# Patient Record
Sex: Female | Born: 1940 | Race: White | Hispanic: No | Marital: Married | Smoking: Never smoker
Health system: Southern US, Community
[De-identification: ages and names within clinical notes are randomized; demographics above are authoritative.]

## PROBLEM LIST (undated history)

## (undated) DIAGNOSIS — T8859XA Other complications of anesthesia, initial encounter: Secondary | ICD-10-CM

## (undated) DIAGNOSIS — I10 Essential (primary) hypertension: Secondary | ICD-10-CM

## (undated) DIAGNOSIS — R42 Dizziness and giddiness: Secondary | ICD-10-CM

## (undated) DIAGNOSIS — T4145XA Adverse effect of unspecified anesthetic, initial encounter: Secondary | ICD-10-CM

## (undated) DIAGNOSIS — K219 Gastro-esophageal reflux disease without esophagitis: Secondary | ICD-10-CM

## (undated) HISTORY — PX: NO PAST SURGERIES: SHX2092

---

## 1998-05-01 ENCOUNTER — Other Ambulatory Visit: Admission: RE | Admit: 1998-05-01 | Discharge: 1998-05-01 | Payer: Self-pay | Admitting: Obstetrics and Gynecology

## 1999-10-11 ENCOUNTER — Other Ambulatory Visit: Admission: RE | Admit: 1999-10-11 | Discharge: 1999-10-11 | Payer: Self-pay | Admitting: Obstetrics and Gynecology

## 2001-01-14 ENCOUNTER — Ambulatory Visit (HOSPITAL_COMMUNITY): Admission: RE | Admit: 2001-01-14 | Discharge: 2001-01-14 | Payer: Self-pay | Admitting: Family Medicine

## 2001-01-14 ENCOUNTER — Encounter: Payer: Self-pay | Admitting: Family Medicine

## 2002-02-03 ENCOUNTER — Other Ambulatory Visit: Admission: RE | Admit: 2002-02-03 | Discharge: 2002-02-03 | Payer: Self-pay | Admitting: Obstetrics and Gynecology

## 2003-04-17 ENCOUNTER — Other Ambulatory Visit: Admission: RE | Admit: 2003-04-17 | Discharge: 2003-04-17 | Payer: Self-pay | Admitting: Obstetrics and Gynecology

## 2004-05-28 ENCOUNTER — Other Ambulatory Visit: Admission: RE | Admit: 2004-05-28 | Discharge: 2004-05-28 | Payer: Self-pay | Admitting: Obstetrics and Gynecology

## 2004-12-13 ENCOUNTER — Ambulatory Visit (HOSPITAL_COMMUNITY): Admission: RE | Admit: 2004-12-13 | Discharge: 2004-12-13 | Payer: Self-pay | Admitting: Family Medicine

## 2006-06-08 ENCOUNTER — Emergency Department (HOSPITAL_COMMUNITY): Admission: EM | Admit: 2006-06-08 | Discharge: 2006-06-08 | Payer: Self-pay | Admitting: Emergency Medicine

## 2006-11-27 ENCOUNTER — Ambulatory Visit (HOSPITAL_COMMUNITY): Admission: RE | Admit: 2006-11-27 | Discharge: 2006-11-27 | Payer: Self-pay | Admitting: Family Medicine

## 2007-01-29 ENCOUNTER — Inpatient Hospital Stay (HOSPITAL_COMMUNITY): Admission: EM | Admit: 2007-01-29 | Discharge: 2007-01-31 | Payer: Self-pay | Admitting: Emergency Medicine

## 2010-01-15 ENCOUNTER — Emergency Department (HOSPITAL_COMMUNITY): Admission: EM | Admit: 2010-01-15 | Discharge: 2010-01-15 | Payer: Self-pay | Admitting: Emergency Medicine

## 2010-08-29 ENCOUNTER — Emergency Department (HOSPITAL_COMMUNITY): Admission: EM | Admit: 2010-08-29 | Discharge: 2010-08-29 | Payer: Self-pay | Admitting: Emergency Medicine

## 2011-03-13 LAB — URINALYSIS, ROUTINE W REFLEX MICROSCOPIC
Bilirubin Urine: NEGATIVE
Glucose, UA: NEGATIVE mg/dL
Ketones, ur: NEGATIVE mg/dL
Nitrite: NEGATIVE
Protein, ur: NEGATIVE mg/dL
Specific Gravity, Urine: 1.023 (ref 1.005–1.030)

## 2011-03-13 LAB — DIFFERENTIAL
Basophils Relative: 0 % (ref 0–1)
Eosinophils Relative: 2 % (ref 0–5)
Lymphs Abs: 1.7 10*3/uL (ref 0.7–4.0)
Neutrophils Relative %: 75 % (ref 43–77)

## 2011-03-13 LAB — URINE CULTURE: Culture  Setup Time: 201109010324

## 2011-03-13 LAB — CBC
MCHC: 35 g/dL (ref 30.0–36.0)
RBC: 4.68 MIL/uL (ref 3.87–5.11)
RDW: 13.4 % (ref 11.5–15.5)
WBC: 10.7 10*3/uL — ABNORMAL HIGH (ref 4.0–10.5)

## 2011-03-13 LAB — URINE MICROSCOPIC-ADD ON

## 2011-03-13 LAB — BASIC METABOLIC PANEL
BUN: 21 mg/dL (ref 6–23)
Calcium: 9.9 mg/dL (ref 8.4–10.5)
GFR calc Af Amer: >60 mL/min (ref >60)
Glucose, Bld: 110 mg/dL — ABNORMAL HIGH (ref 70–99)

## 2011-05-16 NOTE — Discharge Summary (Signed)
NAMESOLE, LENGACHER NO.:  [id number]   MEDICAL RECORD NO.:  [id number]          PATIENT TYPE:  INP   LOCATION:  5032                         FACILITY:  [facility]   PHYSICIAN:  Andres Shad. Rudean Curt, MD     DATE OF BIRTH:  1941-09-01   DATE OF ADMISSION:  01/29/2007  DATE OF DISCHARGE:  01/31/2007                               DISCHARGE SUMMARY   DISCHARGE DIAGNOSIS:  Diverticulitis.   CONDITION ON DISCHARGE:  Stable.   SUMMARY OF HOSPITALIZATION:  [name] is a 70 year old woman who  presented to the emergency department with 2 days of severe lower  abdominal pain described as sharp in nature.  It was associated with  nausea and vomiting.  Her only constitutional symptom was weakness.  In  the emergency department, she was found to have a temperature as high as  99.3.  Her white blood cell count was 19.5 with a left shift at the time  of admission.  Initial studies showed evidence of free fluid and a very  small amount of free air in the peritoneal cavity as well as  diverticulitis in the proximal sigmoid colon.  Surgery was consulted and  felt that this episode of diverticulitis could be treated medically.  Thus, the patient was admitted to the medical service and was treated  with intravenous ciprofloxacin and metronidazole.  She did very well  over the next 2 days with relief of her symptoms, return of her white  count to normal at 10.8 at the time of discharge, and normalization of  her temperature.  She was also able to advance to a full diet without  any discomfort or complications.  Because of her rapid response to  antibiotics and resolution of her leukocytosis, I will discharge her at  this time to complete a 14-day course of antibiotics with oral  ciprofloxacin and metronidazole.  The patient has been advised of  dietary recommendations for diverticulitis as well as potential side  effects of her antibiotic.      Andres Shad. Rudean Curt, MD  Electronically  Signed     PML/MEDQ  D:  01/31/2007  T:  01/31/2007  Job:  [id number]

## 2011-05-16 NOTE — H&P (Signed)
Marissa Gallegos, Marissa Gallegos               ACCOUNT NO.:  [id number]   MEDICAL RECORD NO.:  [id number]          PATIENT TYPE:  INP   LOCATION:  1843                         FACILITY:  [facility]   PHYSICIAN:  Jackie Plum, M.D.DATE OF BIRTH:  December 10, 1941   DATE OF ADMISSION:  01/28/2007  DATE OF DISCHARGE:                              HISTORY & PHYSICAL   CHIEF COMPLAINT:  Abdominal pain.   HPI:  The patient is a 70 year old lady who presented with 2 days of  severe low abdominal pain described as sharp in nature without any  radiation.  It was associated with nausea and vomiting, without any  fever or chills, diarrhea or constipation.  The patient has been feeling  weak.  On account of this, she went to her PCP's office and was referred  to the ED.  Emergency room initial evaluation revealed leukocytosis, and  CT scan indicated diverticulitis with suspected perforation.  The  patient is set to be reviewed by Dr. Lindie Spruce of general surgery and felt  the patient will not need any acute surgical intervention, and he  recommended medical admission with surgical co-management as  consultants.  The patient is therefore admitted for IV antibiotics and  supportive care in this regard.   PAST MEDICAL HISTORY:  Positive for:  1. History of hiatal hernia with GERD.  2. Hypertension.   SOCIAL HISTORY:  The patient is married and lives in her own home.  Has  2 older children who live in [place], 1 of whom is an Art gallery manager and  the other is an ER physician.  She does not smoke cigarettes.  She  drinks alcohol on a social basis only.   FAMILY HISTORY:  Positive for hypertension.   REVIEW OF SYSTEMS:  As mentioned above, otherwise unremarkable.   PHYSICAL EXAMINATION:  VITAL SIGNS:  BP 134/81, pulse 95, respirations  20, temp 99.3 degrees Fahrenheit.  O2 sat is 98% on room air.  GENERAL EXAMINATION:  The patient at the time of my evaluation was  comfortable and her pain is about nearly completely  resolved.  She is in  no acute distress.  She is a pleasant lady.  HEENT:  Normocephalic, atraumatic.  Pupils equal, round, and react.  Extraocular movements intact.  Oropharynx:  Moist.  NECK:  Supple.  No JVD.  LUNGS:  Clear to auscultation.  CARDIAC:  Regular rate and rhythm.  No gallops or murmurs.  ABDOMEN:  Obese and soft.  Mild low abdominal tenderness diffusely on  deep palpation only.  Bowel sounds were present and normoactive.  There  was no rebound tenderness appreciated.  EXTREMITIES:  No cyanosis.  No edema.  CNS:  Nonfocal.   LABORATORY:  CT scan as noted above per report.  UA was negative for  UTI.  WBC count 19.5, hemoglobin 15.8, hematocrit 46.3, MCV 88.6,  platelet count 358.  Sodium 135, potassium 3.7, chloride 103, CO2 of 22,  glucose 168, BUN 19, creatinine 0.7.  Serum calcium 10.4.  Total protein  7.1, albumin 3.9, AST 23, ALT 23.  Lipase 16.   IMPRESSION:  1. Diverticulitis with some  content perforation.  __________      discussion with the emergency department physician regarding the      computed tomography report.  2. Leukocytosis secondary to #1.  3. Nausea and vomiting secondary to #1.  4. Hypertension.  5. Gastroesophageal reflux disease.   The patient will be admitted for IV antibiotics, IV fluid  supplementation, and bed rest, and monitoring of her leukocytosis.  We  will also check SGPT.  __________  .  We will hold her Hyzaar, aspirin,  and Lovenox for now and give her IV PPI, with clonidine patch for  antihypertensive treatment.      Jackie Plum, M.D.  Electronically Signed     GO/MEDQ  D:  01/29/2007  T:  01/29/2007  Job:  [id number]

## 2012-03-09 DIAGNOSIS — H524 Presbyopia: Secondary | ICD-10-CM | POA: Diagnosis not present

## 2012-03-09 DIAGNOSIS — H43819 Vitreous degeneration, unspecified eye: Secondary | ICD-10-CM | POA: Diagnosis not present

## 2012-03-09 DIAGNOSIS — H5200 Hypermetropia, unspecified eye: Secondary | ICD-10-CM | POA: Diagnosis not present

## 2012-03-09 DIAGNOSIS — H2510 Age-related nuclear cataract, unspecified eye: Secondary | ICD-10-CM | POA: Diagnosis not present

## 2012-03-19 DIAGNOSIS — Z01419 Encounter for gynecological examination (general) (routine) without abnormal findings: Secondary | ICD-10-CM | POA: Diagnosis not present

## 2012-03-19 DIAGNOSIS — Z124 Encounter for screening for malignant neoplasm of cervix: Secondary | ICD-10-CM | POA: Diagnosis not present

## 2012-11-02 DIAGNOSIS — Z1231 Encounter for screening mammogram for malignant neoplasm of breast: Secondary | ICD-10-CM | POA: Diagnosis not present

## 2012-12-10 DIAGNOSIS — Z23 Encounter for immunization: Secondary | ICD-10-CM | POA: Diagnosis not present

## 2012-12-30 DIAGNOSIS — E559 Vitamin D deficiency, unspecified: Secondary | ICD-10-CM | POA: Diagnosis not present

## 2012-12-30 DIAGNOSIS — Z0000 Encounter for general adult medical examination without abnormal findings: Secondary | ICD-10-CM | POA: Diagnosis not present

## 2012-12-30 DIAGNOSIS — I10 Essential (primary) hypertension: Secondary | ICD-10-CM | POA: Diagnosis not present

## 2012-12-30 DIAGNOSIS — K219 Gastro-esophageal reflux disease without esophagitis: Secondary | ICD-10-CM | POA: Diagnosis not present

## 2012-12-30 DIAGNOSIS — Z23 Encounter for immunization: Secondary | ICD-10-CM | POA: Diagnosis not present

## 2012-12-30 DIAGNOSIS — M899 Disorder of bone, unspecified: Secondary | ICD-10-CM | POA: Diagnosis not present

## 2013-02-09 DIAGNOSIS — E213 Hyperparathyroidism, unspecified: Secondary | ICD-10-CM | POA: Diagnosis not present

## 2013-03-07 DIAGNOSIS — Z8262 Family history of osteoporosis: Secondary | ICD-10-CM | POA: Diagnosis not present

## 2013-03-07 DIAGNOSIS — M899 Disorder of bone, unspecified: Secondary | ICD-10-CM | POA: Diagnosis not present

## 2013-03-14 DIAGNOSIS — H43819 Vitreous degeneration, unspecified eye: Secondary | ICD-10-CM | POA: Diagnosis not present

## 2013-03-14 DIAGNOSIS — D2310 Other benign neoplasm of skin of unspecified eyelid, including canthus: Secondary | ICD-10-CM | POA: Diagnosis not present

## 2013-03-14 DIAGNOSIS — H2510 Age-related nuclear cataract, unspecified eye: Secondary | ICD-10-CM | POA: Diagnosis not present

## 2013-03-14 DIAGNOSIS — H5200 Hypermetropia, unspecified eye: Secondary | ICD-10-CM | POA: Diagnosis not present

## 2013-03-16 DIAGNOSIS — J069 Acute upper respiratory infection, unspecified: Secondary | ICD-10-CM | POA: Diagnosis not present

## 2013-03-16 DIAGNOSIS — J040 Acute laryngitis: Secondary | ICD-10-CM | POA: Diagnosis not present

## 2013-03-23 DIAGNOSIS — H00019 Hordeolum externum unspecified eye, unspecified eyelid: Secondary | ICD-10-CM | POA: Diagnosis not present

## 2013-04-11 DIAGNOSIS — R5383 Other fatigue: Secondary | ICD-10-CM | POA: Diagnosis not present

## 2013-04-11 DIAGNOSIS — M949 Disorder of cartilage, unspecified: Secondary | ICD-10-CM | POA: Diagnosis not present

## 2013-04-11 DIAGNOSIS — Z8639 Personal history of other endocrine, nutritional and metabolic disease: Secondary | ICD-10-CM | POA: Diagnosis not present

## 2013-04-11 DIAGNOSIS — H00019 Hordeolum externum unspecified eye, unspecified eyelid: Secondary | ICD-10-CM | POA: Diagnosis not present

## 2013-04-11 DIAGNOSIS — R5381 Other malaise: Secondary | ICD-10-CM | POA: Diagnosis not present

## 2013-04-11 DIAGNOSIS — G4762 Sleep related leg cramps: Secondary | ICD-10-CM | POA: Diagnosis not present

## 2013-04-12 DIAGNOSIS — H00019 Hordeolum externum unspecified eye, unspecified eyelid: Secondary | ICD-10-CM | POA: Diagnosis not present

## 2013-05-30 DIAGNOSIS — Z124 Encounter for screening for malignant neoplasm of cervix: Secondary | ICD-10-CM | POA: Diagnosis not present

## 2013-05-30 DIAGNOSIS — Z01419 Encounter for gynecological examination (general) (routine) without abnormal findings: Secondary | ICD-10-CM | POA: Diagnosis not present

## 2013-07-05 DIAGNOSIS — I10 Essential (primary) hypertension: Secondary | ICD-10-CM | POA: Diagnosis not present

## 2013-08-15 DIAGNOSIS — I10 Essential (primary) hypertension: Secondary | ICD-10-CM | POA: Diagnosis not present

## 2013-11-03 DIAGNOSIS — Z1231 Encounter for screening mammogram for malignant neoplasm of breast: Secondary | ICD-10-CM | POA: Diagnosis not present

## 2014-01-19 DIAGNOSIS — M25569 Pain in unspecified knee: Secondary | ICD-10-CM | POA: Diagnosis not present

## 2014-01-19 DIAGNOSIS — I10 Essential (primary) hypertension: Secondary | ICD-10-CM | POA: Diagnosis not present

## 2014-01-20 ENCOUNTER — Ambulatory Visit
Admission: RE | Admit: 2014-01-20 | Discharge: 2014-01-20 | Disposition: A | Discharge status: Home / Self Care | Payer: Medicare Other | Source: Ambulatory Visit | Attending: Family Medicine | Admitting: Family Medicine

## 2014-01-20 ENCOUNTER — Other Ambulatory Visit: Payer: Self-pay | Admitting: Family Medicine

## 2014-01-20 DIAGNOSIS — M25569 Pain in unspecified knee: Secondary | ICD-10-CM

## 2014-01-20 DIAGNOSIS — M1710 Unilateral primary osteoarthritis, unspecified knee: Secondary | ICD-10-CM | POA: Diagnosis not present

## 2014-01-20 DIAGNOSIS — IMO0002 Reserved for concepts with insufficient information to code with codable children: Secondary | ICD-10-CM | POA: Diagnosis not present

## 2014-02-01 DIAGNOSIS — M1710 Unilateral primary osteoarthritis, unspecified knee: Secondary | ICD-10-CM | POA: Diagnosis not present

## 2014-03-10 DIAGNOSIS — M25569 Pain in unspecified knee: Secondary | ICD-10-CM | POA: Diagnosis not present

## 2014-03-17 DIAGNOSIS — H5200 Hypermetropia, unspecified eye: Secondary | ICD-10-CM | POA: Diagnosis not present

## 2014-03-17 DIAGNOSIS — H2510 Age-related nuclear cataract, unspecified eye: Secondary | ICD-10-CM | POA: Diagnosis not present

## 2014-03-17 DIAGNOSIS — H43819 Vitreous degeneration, unspecified eye: Secondary | ICD-10-CM | POA: Diagnosis not present

## 2014-03-17 DIAGNOSIS — H524 Presbyopia: Secondary | ICD-10-CM | POA: Diagnosis not present

## 2014-04-10 ENCOUNTER — Other Ambulatory Visit: Payer: Self-pay | Admitting: Orthopaedic Surgery

## 2014-04-11 ENCOUNTER — Encounter (HOSPITAL_COMMUNITY): Payer: Self-pay | Admitting: Pharmacy Technician

## 2014-04-15 NOTE — Pre-Procedure Instructions (Signed)
Marissa Gallegos  04/15/2014   Your procedure is scheduled on:  April 28  Report to [facility] Entrance "A" [address] at [employer] AM.  Call this number if you have problems the morning of surgery: [phone]   Remember:   Do not eat food or drink liquids after midnight.   Take these medicines the morning of surgery with A SIP OF WATER: Amlodipine, Clonidine, Nexium,    STOP Multiple Vitamins, Magnesium, Voltaren, Vitamin D3, Calcium, today   STOP/ Do not take Aspirin, Aleve, Naproxen, Advil, Ibuprofen, Vitamin, Herbs, or Supplements starting today    Do not wear jewelry, make-up or nail polish.  Do not wear lotions, powders, or perfumes. You may wear deodorant.  Do not shave 48 hours prior to surgery. Men may shave face and neck.  Do not bring valuables to the hospital.  [facility] is not responsible for any belongings or valuables.               Contacts, dentures or bridgework may not be worn into surgery.  Leave suitcase in the car. After surgery it may be brought to your room.  For patients admitted to the hospital, discharge time is determined by your treatment team.               Special Instructions: See [facility] Preparing For Surgery   Please read over the following fact sheets that you were given: Pain Booklet, Coughing and Deep Breathing, Blood Transfusion Information, Total Joint Packet and Surgical Site Infection Prevention

## 2014-04-15 NOTE — Pre-Procedure Instructions (Signed)
[facility] - Preparing for Surgery  Before surgery, you can play an important role.  Because skin is not sterile, your skin needs to be as free of germs as possible.  You can reduce the number of germs on you skin by washing with CHG (chlorahexidine gluconate) soap before surgery.  CHG is an antiseptic cleaner which kills germs and bonds with the skin to continue killing germs even after washing.  Please DO NOT use if you have an allergy to CHG or antibacterial soaps.  If your skin becomes reddened/irritated stop using the CHG and inform your nurse when you arrive at Short Stay.  Do not shave (including legs and underarms) for at least 48 hours prior to the first CHG shower.  You may shave your face.  Please follow these instructions carefully:   1.  Shower with CHG Soap the night before surgery and the morning of Surgery.  2.  If you choose to wash your hair, wash your hair first as usual with your normal shampoo.  3.  After you shampoo, rinse your hair and body thoroughly to remove the shampoo.  4.  Use CHG as you would any other liquid soap.  You can apply CHG directly to the skin and wash gently with scrungie or a clean washcloth.  5.  Apply the CHG Soap to your body ONLY FROM THE NECK DOWN.  Do not use on open wounds or open sores.  Avoid contact with your eyes, ears, mouth and genitals (private parts).  Wash genitals (private parts) with your normal soap.  6.  Wash thoroughly, paying special attention to the area where your surgery will be performed.  7.  Thoroughly rinse your body with warm water from the neck down.  8.  DO NOT shower/wash with your normal soap after using and rinsing off the CHG Soap.  9.  Pat yourself dry with a clean towel.            10.  Wear clean pajamas.            11.  Place clean sheets on your bed the night of your first shower and do not sleep with pets.  Day of Surgery  Do not apply any lotions the morning of surgery.  Please wear clean clothes to the  hospital/surgery center.   

## 2014-04-17 ENCOUNTER — Encounter (HOSPITAL_COMMUNITY)
Admission: RE | Admit: 2014-04-17 | Discharge: 2014-04-17 | Disposition: A | Discharge status: Home / Self Care | Payer: Medicare Other | Source: Ambulatory Visit | Attending: Orthopaedic Surgery | Admitting: Orthopaedic Surgery

## 2014-04-17 ENCOUNTER — Encounter (HOSPITAL_COMMUNITY): Payer: Self-pay

## 2014-04-17 DIAGNOSIS — Z01810 Encounter for preprocedural cardiovascular examination: Secondary | ICD-10-CM | POA: Diagnosis not present

## 2014-04-17 DIAGNOSIS — Z01812 Encounter for preprocedural laboratory examination: Secondary | ICD-10-CM | POA: Insufficient documentation

## 2014-04-17 DIAGNOSIS — Z01818 Encounter for other preprocedural examination: Secondary | ICD-10-CM | POA: Diagnosis not present

## 2014-04-17 HISTORY — DX: Other complications of anesthesia, initial encounter: T88.59XA

## 2014-04-17 HISTORY — DX: Dizziness and giddiness: R42

## 2014-04-17 HISTORY — DX: Adverse effect of unspecified anesthetic, initial encounter: T41.45XA

## 2014-04-17 HISTORY — DX: Essential (primary) hypertension: I10

## 2014-04-17 HISTORY — DX: Gastro-esophageal reflux disease without esophagitis: K21.9

## 2014-04-17 LAB — CBC WITH DIFFERENTIAL/PLATELET
BASOS ABS: 0 10*3/uL (ref 0.0–0.1)
BASOS PCT: 0 % (ref 0–1)
Eosinophils Absolute: 0.1 10*3/uL (ref 0.0–0.7)
Eosinophils Relative: 1 % (ref 0–5)
HCT: 45.2 % (ref 36.0–46.0)
Hemoglobin: 15.2 g/dL — ABNORMAL HIGH (ref 12.0–15.0)
Lymphocytes Relative: 29 % (ref 12–46)
Lymphs Abs: 1.9 10*3/uL (ref 0.7–4.0)
MCH: 30.6 pg (ref 26.0–34.0)
MCHC: 33.6 g/dL (ref 30.0–36.0)
MCV: 91.1 fL (ref 78.0–100.0)
Monocytes Absolute: 0.5 10*3/uL (ref 0.1–1.0)
Monocytes Relative: 7 % (ref 3–12)
NEUTROS ABS: 4.1 10*3/uL (ref 1.7–7.7)
Neutrophils Relative %: 63 % (ref 43–77)
PLATELETS: 285 10*3/uL (ref 150–400)
RBC: 4.96 MIL/uL (ref 3.87–5.11)
RDW: 13.3 % (ref 11.5–15.5)
WBC: 6.5 10*3/uL (ref 4.0–10.5)

## 2014-04-17 LAB — PROTIME-INR
INR: 1.05 (ref 0.00–1.49)
Prothrombin Time: 13.5 seconds (ref 11.6–15.2)

## 2014-04-17 LAB — URINE MICROSCOPIC-ADD ON

## 2014-04-17 LAB — TYPE AND SCREEN
ABO/RH(D): O POS
Antibody Screen: NEGATIVE

## 2014-04-17 LAB — BASIC METABOLIC PANEL
BUN: 16 mg/dL (ref 6–23)
CO2: 24 mEq/L (ref 19–32)
CREATININE: 0.71 mg/dL (ref 0.50–1.10)
Calcium: 11.8 mg/dL — ABNORMAL HIGH (ref 8.4–10.5)
Chloride: 105 mEq/L (ref 96–112)
GFR, EST NON AFRICAN AMERICAN: 85 mL/min — AB (ref >90)
Glucose, Bld: 103 mg/dL — ABNORMAL HIGH (ref 70–99)
Potassium: 4 mEq/L (ref 3.7–5.3)
Sodium: 144 mEq/L (ref 137–147)

## 2014-04-17 LAB — URINALYSIS, ROUTINE W REFLEX MICROSCOPIC
Bilirubin Urine: NEGATIVE
GLUCOSE, UA: NEGATIVE mg/dL
KETONES UR: NEGATIVE mg/dL
Nitrite: NEGATIVE
Protein, ur: NEGATIVE mg/dL
Specific Gravity, Urine: 1.022 (ref 1.005–1.030)
Urobilinogen, UA: 0.2 mg/dL (ref 0.0–1.0)
pH: 6 (ref 5.0–8.0)

## 2014-04-17 LAB — ABO/RH: ABO/RH(D): O POS

## 2014-04-17 LAB — SURGICAL PCR SCREEN
MRSA, PCR: POSITIVE — AB
Staphylococcus aureus: POSITIVE — AB

## 2014-04-17 LAB — APTT: aPTT: 31 seconds (ref 24–37)

## 2014-04-18 DIAGNOSIS — B351 Tinea unguium: Secondary | ICD-10-CM | POA: Diagnosis not present

## 2014-04-18 DIAGNOSIS — M79609 Pain in unspecified limb: Secondary | ICD-10-CM | POA: Diagnosis not present

## 2014-04-24 MED ORDER — CEFAZOLIN SODIUM-DEXTROSE 2-3 GM-% IV SOLR
2.0000 g | INTRAVENOUS | Status: AC
Start: 2014-04-25 — End: 2014-04-25
  Administered 2014-04-25: 2 g via INTRAVENOUS
  Filled 2014-04-24: qty 50

## 2014-04-24 NOTE — H&P (Signed)
TOTAL KNEE ADMISSION H&P  Patient is being admitted for right total knee arthroplasty.  Subjective:  Chief Complaint:right knee pain.  HPI: Marissa Gallegos, 73 y.o. female, has a history of pain and functional disability in the right knee due to arthritis and has failed non-surgical conservative treatments for greater than 12 weeks to includeNSAID's and/or analgesics, corticosteriod injections, flexibility and strengthening excercises, weight reduction as appropriate and activity modification.  Onset of symptoms was gradual, starting 3 years ago with gradually worsening course since that time. The patient noted no past surgery on the right knee(s).  Patient currently rates pain in the right knee(s) at 10 out of 10 with activity. Patient has night pain, worsening of pain with activity and weight bearing, pain that interferes with activities of daily living and crepitus.  Patient has evidence of subchondral cysts, subchondral sclerosis, periarticular osteophytes and joint space narrowing by imaging studies. There is no active infection.  There are no active problems to display for this patient.  Past Medical History  Diagnosis Date  . GERD (gastroesophageal reflux disease)   . Complication of anesthesia     poss low BP with anesthesia,vagal  . Hypertension     dr r. Kenton Kingfisher  . Vertigo     Past Surgical History  Procedure Laterality Date  . No past surgeries      No prescriptions prior to admission   No Known Allergies  History  Substance Use Topics  . Smoking status: Never Smoker   . Smokeless tobacco: Not on file  . Alcohol Use: Yes     Comment: occ    No family history on file.   Review of Systems  Musculoskeletal: Positive for joint pain.       Right greater than left knee pain  All other systems reviewed and are negative.   Objective:  Physical Exam  Constitutional: She is oriented to person, place, and time. She appears well-developed and well-nourished.  HENT:  Head:  Normocephalic and atraumatic.  Eyes: Conjunctivae and EOM are normal. Pupils are equal, round, and reactive to light.  Neck: Normal range of motion.  Cardiovascular: Normal rate and normal heart sounds.   Respiratory: Effort normal.  GI: Soft.  Musculoskeletal:  Both knees move about 0-120.  There is no effusion on either side.  She has crepitation right greater than left and medial joint line pain on both sides but worse on the right.   Hip motion is full and pain free and SLR is negative on both sides.  There is no palpable LAD behind either knee.  Sensation and motor function are intact on both sides and there are palpable pulses on both sides.    Neurological: She is alert and oriented to person, place, and time.  Skin: Skin is warm and dry.  Psychiatric: She has a normal mood and affect. Her behavior is normal. Judgment and thought content normal.    Vital signs in last 24 hours:    Labs:   There is no height or weight on file to calculate BMI.   Imaging Review Plain radiographs demonstrate severe degenerative joint disease of the right knee(s). The overall alignment ismild varus. The bone quality appears to be good for age and reported activity level.  Assessment/Plan:  End stage arthritis, right knee  The patient history, physical examination, clinical judgment of the provider and imaging studies are consistent with end stage degenerative joint disease of the right knee(s) and total knee arthroplasty is deemed medically necessary. The treatment  options including medical management, injection therapy arthroscopy and arthroplasty were discussed at length. The risks and benefits of total knee arthroplasty were presented and reviewed. The risks due to aseptic loosening, infection, stiffness, patella tracking problems, thromboembolic complications and other imponderables were discussed. The patient acknowledged the explanation, agreed to proceed with the plan and consent was signed.  Patient is being admitted for inpatient treatment for surgery, pain control, PT, OT, prophylactic antibiotics, VTE prophylaxis, progressive ambulation and ADL's and discharge planning. The patient is planning to be discharged home with home health services

## 2014-04-25 ENCOUNTER — Encounter (HOSPITAL_COMMUNITY)
Admission: RE | Disposition: A | Discharge status: Home / Self Care | Payer: Self-pay | Source: Ambulatory Visit | Attending: Orthopaedic Surgery

## 2014-04-25 ENCOUNTER — Inpatient Hospital Stay (HOSPITAL_COMMUNITY): Payer: Medicare Other | Admitting: Anesthesiology

## 2014-04-25 ENCOUNTER — Inpatient Hospital Stay (HOSPITAL_COMMUNITY)
Admission: RE | Admit: 2014-04-25 | Discharge: 2014-04-27 | DRG: 470 | Disposition: A | Discharge status: Home / Self Care | Payer: Medicare Other | Source: Ambulatory Visit | Attending: Orthopaedic Surgery | Admitting: Orthopaedic Surgery

## 2014-04-25 ENCOUNTER — Encounter (HOSPITAL_COMMUNITY): Payer: Medicare Other | Admitting: Anesthesiology

## 2014-04-25 ENCOUNTER — Encounter (HOSPITAL_COMMUNITY): Payer: Self-pay | Admitting: *Deleted

## 2014-04-25 DIAGNOSIS — I10 Essential (primary) hypertension: Secondary | ICD-10-CM | POA: Diagnosis not present

## 2014-04-25 DIAGNOSIS — G8918 Other acute postprocedural pain: Secondary | ICD-10-CM | POA: Diagnosis not present

## 2014-04-25 DIAGNOSIS — M25569 Pain in unspecified knee: Secondary | ICD-10-CM | POA: Diagnosis not present

## 2014-04-25 DIAGNOSIS — K219 Gastro-esophageal reflux disease without esophagitis: Secondary | ICD-10-CM | POA: Diagnosis not present

## 2014-04-25 DIAGNOSIS — M1711 Unilateral primary osteoarthritis, right knee: Secondary | ICD-10-CM | POA: Diagnosis present

## 2014-04-25 DIAGNOSIS — IMO0002 Reserved for concepts with insufficient information to code with codable children: Secondary | ICD-10-CM | POA: Diagnosis not present

## 2014-04-25 DIAGNOSIS — M1710 Unilateral primary osteoarthritis, unspecified knee: Principal | ICD-10-CM | POA: Diagnosis present

## 2014-04-25 HISTORY — PX: TOTAL KNEE ARTHROPLASTY: SHX125

## 2014-04-25 SURGERY — ARTHROPLASTY, KNEE, TOTAL
Anesthesia: General | Site: Knee | Laterality: Right

## 2014-04-25 MED ORDER — ROCURONIUM BROMIDE 50 MG/5ML IV SOLN
INTRAVENOUS | Status: AC
  Filled 2014-04-25: qty 1

## 2014-04-25 MED ORDER — HYDROCODONE-ACETAMINOPHEN 5-325 MG PO TABS
1.0000 | ORAL_TABLET | ORAL | Status: DC | PRN
  Administered 2014-04-25 – 2014-04-27 (×8): 2 via ORAL
  Filled 2014-04-25 (×8): qty 2

## 2014-04-25 MED ORDER — LIDOCAINE HCL (CARDIAC) 20 MG/ML IV SOLN
INTRAVENOUS | Status: AC
  Filled 2014-04-25: qty 5

## 2014-04-25 MED ORDER — VITAMIN D3 25 MCG (1000 UNIT) PO TABS
1000.0000 [IU] | ORAL_TABLET | Freq: Every day | ORAL | Status: DC
  Administered 2014-04-25 – 2014-04-26 (×2): 1000 [IU] via ORAL
  Filled 2014-04-25 (×3): qty 1

## 2014-04-25 MED ORDER — ARTIFICIAL TEARS OP OINT
TOPICAL_OINTMENT | OPHTHALMIC | Status: AC
  Filled 2014-04-25: qty 3.5

## 2014-04-25 MED ORDER — LACTATED RINGERS IV SOLN
INTRAVENOUS | Status: DC
  Administered 2014-04-25 – 2014-04-26 (×2): via INTRAVENOUS

## 2014-04-25 MED ORDER — PROPOFOL 10 MG/ML IV BOLUS
INTRAVENOUS | Status: AC
  Filled 2014-04-25: qty 20

## 2014-04-25 MED ORDER — METOCLOPRAMIDE HCL 5 MG/ML IJ SOLN
10.0000 mg | Freq: Once | INTRAMUSCULAR | Status: AC
  Administered 2014-04-25: 10 mg via INTRAVENOUS

## 2014-04-25 MED ORDER — HYDROCHLOROTHIAZIDE 25 MG PO TABS
25.0000 mg | ORAL_TABLET | Freq: Every day | ORAL | Status: DC
  Administered 2014-04-25 – 2014-04-27 (×3): 25 mg via ORAL
  Filled 2014-04-25 (×3): qty 1

## 2014-04-25 MED ORDER — LOSARTAN POTASSIUM 50 MG PO TABS
100.0000 mg | ORAL_TABLET | Freq: Every day | ORAL | Status: DC
  Administered 2014-04-25 – 2014-04-27 (×3): 100 mg via ORAL
  Filled 2014-04-25 (×3): qty 2

## 2014-04-25 MED ORDER — FENTANYL CITRATE 0.05 MG/ML IJ SOLN
INTRAMUSCULAR | Status: AC
  Filled 2014-04-25: qty 5

## 2014-04-25 MED ORDER — METOCLOPRAMIDE HCL 5 MG/ML IJ SOLN
5.0000 mg | Freq: Three times a day (TID) | INTRAMUSCULAR | Status: DC | PRN
  Administered 2014-04-26: 10 mg via INTRAVENOUS
  Filled 2014-04-25: qty 2

## 2014-04-25 MED ORDER — ONDANSETRON HCL 4 MG/2ML IJ SOLN
4.0000 mg | Freq: Four times a day (QID) | INTRAMUSCULAR | Status: DC | PRN
  Administered 2014-04-25 – 2014-04-26 (×2): 4 mg via INTRAVENOUS
  Filled 2014-04-25 (×2): qty 2

## 2014-04-25 MED ORDER — GLYCOPYRROLATE 0.2 MG/ML IJ SOLN
INTRAMUSCULAR | Status: AC
  Filled 2014-04-25: qty 2

## 2014-04-25 MED ORDER — PHENYLEPHRINE HCL 10 MG/ML IJ SOLN
INTRAMUSCULAR | Status: DC | PRN
  Administered 2014-04-25 (×2): 80 ug via INTRAVENOUS

## 2014-04-25 MED ORDER — PANTOPRAZOLE SODIUM 40 MG PO TBEC
80.0000 mg | DELAYED_RELEASE_TABLET | Freq: Every day | ORAL | Status: DC
  Administered 2014-04-26 – 2014-04-27 (×2): 80 mg via ORAL
  Filled 2014-04-25 (×2): qty 2

## 2014-04-25 MED ORDER — ONDANSETRON HCL 4 MG PO TABS: 4.0000 mg | ORAL_TABLET | Freq: Four times a day (QID) | ORAL | Status: DC | PRN

## 2014-04-25 MED ORDER — METHOCARBAMOL 100 MG/ML IJ SOLN
500.0000 mg | Freq: Four times a day (QID) | INTRAVENOUS | Status: DC | PRN
  Administered 2014-04-25: 500 mg via INTRAVENOUS
  Filled 2014-04-25 (×2): qty 5

## 2014-04-25 MED ORDER — ONDANSETRON HCL 4 MG/2ML IJ SOLN
INTRAMUSCULAR | Status: DC | PRN
  Administered 2014-04-25: 4 mg via INTRAVENOUS

## 2014-04-25 MED ORDER — METOCLOPRAMIDE HCL 5 MG/ML IJ SOLN
INTRAMUSCULAR | Status: AC
  Filled 2014-04-25: qty 2

## 2014-04-25 MED ORDER — VITAMIN D-3 25 MCG (1000 UT) PO CAPS: 1000.0000 [IU] | ORAL_CAPSULE | Freq: Two times a day (BID) | ORAL | Status: DC

## 2014-04-25 MED ORDER — NEOSTIGMINE METHYLSULFATE 1 MG/ML IJ SOLN
INTRAMUSCULAR | Status: DC | PRN
  Administered 2014-04-25: 3 mg via INTRAVENOUS

## 2014-04-25 MED ORDER — PHENYLEPHRINE 40 MCG/ML (10ML) SYRINGE FOR IV PUSH (FOR BLOOD PRESSURE SUPPORT)
PREFILLED_SYRINGE | INTRAVENOUS | Status: AC
  Filled 2014-04-25: qty 10

## 2014-04-25 MED ORDER — MIDAZOLAM HCL 5 MG/5ML IJ SOLN
INTRAMUSCULAR | Status: DC | PRN
  Administered 2014-04-25: 2 mg via INTRAVENOUS

## 2014-04-25 MED ORDER — HYDROMORPHONE HCL PF 1 MG/ML IJ SOLN
0.2500 mg | INTRAMUSCULAR | Status: DC | PRN
  Administered 2014-04-25 (×2): 0.25 mg via INTRAVENOUS

## 2014-04-25 MED ORDER — ONDANSETRON HCL 4 MG/2ML IJ SOLN
INTRAMUSCULAR | Status: AC
  Filled 2014-04-25: qty 2

## 2014-04-25 MED ORDER — NORETHINDRONE-ETH ESTRADIOL 1-5 MG-MCG PO TABS: 1.0000 | ORAL_TABLET | Freq: Every day | ORAL | Status: DC

## 2014-04-25 MED ORDER — NEOSTIGMINE METHYLSULFATE 1 MG/ML IJ SOLN
INTRAMUSCULAR | Status: AC
  Filled 2014-04-25: qty 10

## 2014-04-25 MED ORDER — DOCUSATE SODIUM 100 MG PO CAPS
100.0000 mg | ORAL_CAPSULE | Freq: Two times a day (BID) | ORAL | Status: DC
  Administered 2014-04-25 – 2014-04-27 (×4): 100 mg via ORAL
  Filled 2014-04-25 (×5): qty 1

## 2014-04-25 MED ORDER — ACETAMINOPHEN 650 MG RE SUPP: 650.0000 mg | Freq: Four times a day (QID) | RECTAL | Status: DC | PRN

## 2014-04-25 MED ORDER — MAGNESIUM OXIDE 400 (241.3 MG) MG PO TABS
200.0000 mg | ORAL_TABLET | Freq: Two times a day (BID) | ORAL | Status: DC
  Administered 2014-04-25 – 2014-04-27 (×5): 200 mg via ORAL
  Filled 2014-04-25 (×6): qty 0.5

## 2014-04-25 MED ORDER — VITAMIN D3 25 MCG (1000 UNIT) PO TABS
2000.0000 [IU] | ORAL_TABLET | Freq: Every day | ORAL | Status: DC
  Administered 2014-04-26 – 2014-04-27 (×2): 2000 [IU] via ORAL
  Filled 2014-04-25 (×2): qty 2

## 2014-04-25 MED ORDER — PROPOFOL 10 MG/ML IV BOLUS
INTRAVENOUS | Status: DC | PRN
  Administered 2014-04-25: 160 mg via INTRAVENOUS

## 2014-04-25 MED ORDER — VANCOMYCIN HCL IN DEXTROSE 1-5 GM/200ML-% IV SOLN
INTRAVENOUS | Status: AC
  Filled 2014-04-25: qty 200

## 2014-04-25 MED ORDER — CHLORHEXIDINE GLUCONATE 4 % EX LIQD
60.0000 mL | Freq: Once | CUTANEOUS | Status: DC
  Filled 2014-04-25: qty 60

## 2014-04-25 MED ORDER — ALUM & MAG HYDROXIDE-SIMETH 200-200-20 MG/5ML PO SUSP: 30.0000 mL | ORAL | Status: DC | PRN

## 2014-04-25 MED ORDER — METHOCARBAMOL 500 MG PO TABS
500.0000 mg | ORAL_TABLET | Freq: Four times a day (QID) | ORAL | Status: DC | PRN
  Administered 2014-04-25 – 2014-04-27 (×5): 500 mg via ORAL
  Filled 2014-04-25 (×6): qty 1

## 2014-04-25 MED ORDER — ROCURONIUM BROMIDE 100 MG/10ML IV SOLN
INTRAVENOUS | Status: DC | PRN
  Administered 2014-04-25: 30 mg via INTRAVENOUS

## 2014-04-25 MED ORDER — BISACODYL 5 MG PO TBEC: 5.0000 mg | DELAYED_RELEASE_TABLET | Freq: Every day | ORAL | Status: DC | PRN

## 2014-04-25 MED ORDER — GLYCOPYRROLATE 0.2 MG/ML IJ SOLN
INTRAMUSCULAR | Status: DC | PRN
  Administered 2014-04-25: 0.4 mg via INTRAVENOUS

## 2014-04-25 MED ORDER — ASPIRIN EC 325 MG PO TBEC
325.0000 mg | DELAYED_RELEASE_TABLET | Freq: Two times a day (BID) | ORAL | Status: DC
  Administered 2014-04-26 – 2014-04-27 (×3): 325 mg via ORAL
  Filled 2014-04-25 (×5): qty 1

## 2014-04-25 MED ORDER — LIDOCAINE HCL (CARDIAC) 20 MG/ML IV SOLN
INTRAVENOUS | Status: DC | PRN
  Administered 2014-04-25: 60 mg via INTRAVENOUS

## 2014-04-25 MED ORDER — HYDROMORPHONE HCL PF 1 MG/ML IJ SOLN
INTRAMUSCULAR | Status: AC
  Filled 2014-04-25: qty 1

## 2014-04-25 MED ORDER — MENTHOL 3 MG MT LOZG: 1.0000 | LOZENGE | OROMUCOSAL | Status: DC | PRN

## 2014-04-25 MED ORDER — CEFAZOLIN SODIUM-DEXTROSE 2-3 GM-% IV SOLR
2.0000 g | Freq: Four times a day (QID) | INTRAVENOUS | Status: AC
  Administered 2014-04-25 (×2): 2 g via INTRAVENOUS
  Filled 2014-04-25 (×2): qty 50

## 2014-04-25 MED ORDER — 0.9 % SODIUM CHLORIDE (POUR BTL) OPTIME
TOPICAL | Status: DC | PRN
  Administered 2014-04-25: 1000 mL

## 2014-04-25 MED ORDER — SODIUM CHLORIDE 0.9 % IR SOLN
Status: DC | PRN
  Administered 2014-04-25 (×2): 1000 mL

## 2014-04-25 MED ORDER — MIDAZOLAM HCL 2 MG/2ML IJ SOLN
INTRAMUSCULAR | Status: AC
  Filled 2014-04-25: qty 2

## 2014-04-25 MED ORDER — ACETAMINOPHEN 325 MG PO TABS: 650.0000 mg | ORAL_TABLET | Freq: Four times a day (QID) | ORAL | Status: DC | PRN

## 2014-04-25 MED ORDER — PHENOL 1.4 % MT LIQD: 1.0000 | OROMUCOSAL | Status: DC | PRN

## 2014-04-25 MED ORDER — LACTATED RINGERS IV SOLN
INTRAVENOUS | Status: DC | PRN
  Administered 2014-04-25: 07:00:00 via INTRAVENOUS

## 2014-04-25 MED ORDER — DIPHENHYDRAMINE HCL 12.5 MG/5ML PO ELIX: 12.5000 mg | ORAL_SOLUTION | ORAL | Status: DC | PRN

## 2014-04-25 MED ORDER — VANCOMYCIN HCL IN DEXTROSE 1-5 GM/200ML-% IV SOLN
1000.0000 mg | Freq: Once | INTRAVENOUS | Status: DC
  Filled 2014-04-25: qty 200

## 2014-04-25 MED ORDER — HYDROMORPHONE HCL PF 1 MG/ML IJ SOLN
0.5000 mg | INTRAMUSCULAR | Status: DC | PRN
  Administered 2014-04-25 – 2014-04-26 (×2): 1 mg via INTRAVENOUS
  Filled 2014-04-25 (×2): qty 1

## 2014-04-25 MED ORDER — METHOCARBAMOL 500 MG PO TABS
500.0000 mg | ORAL_TABLET | ORAL | Status: DC
  Filled 2014-04-25: qty 1

## 2014-04-25 MED ORDER — LACTATED RINGERS IV SOLN: INTRAVENOUS | Status: DC

## 2014-04-25 MED ORDER — AMLODIPINE BESYLATE 10 MG PO TABS
10.0000 mg | ORAL_TABLET | Freq: Every day | ORAL | Status: DC
  Administered 2014-04-26 – 2014-04-27 (×2): 10 mg via ORAL
  Filled 2014-04-25 (×2): qty 1

## 2014-04-25 MED ORDER — VANCOMYCIN HCL 1000 MG IV SOLR
1000.0000 mg | Freq: Two times a day (BID) | INTRAVENOUS | Status: DC
  Administered 2014-04-25 (×2): 1000 mg via INTRAVENOUS
  Filled 2014-04-25 (×4): qty 1000

## 2014-04-25 MED ORDER — FERROUS SULFATE 325 (65 FE) MG PO TABS
325.0000 mg | ORAL_TABLET | Freq: Two times a day (BID) | ORAL | Status: DC
  Administered 2014-04-25 – 2014-04-27 (×4): 325 mg via ORAL
  Filled 2014-04-25 (×6): qty 1

## 2014-04-25 MED ORDER — FENTANYL CITRATE 0.05 MG/ML IJ SOLN
INTRAMUSCULAR | Status: DC | PRN
  Administered 2014-04-25: 100 ug via INTRAVENOUS
  Administered 2014-04-25 (×6): 50 ug via INTRAVENOUS

## 2014-04-25 MED ORDER — CLONIDINE HCL 0.1 MG PO TABS
0.1000 mg | ORAL_TABLET | Freq: Two times a day (BID) | ORAL | Status: DC
  Administered 2014-04-25 – 2014-04-27 (×4): 0.1 mg via ORAL
  Filled 2014-04-25 (×5): qty 1

## 2014-04-25 MED ORDER — MAGNESIUM 250 MG PO TABS: 250.0000 mg | ORAL_TABLET | Freq: Two times a day (BID) | ORAL | Status: DC

## 2014-04-25 MED ORDER — METOCLOPRAMIDE HCL 10 MG PO TABS: 5.0000 mg | ORAL_TABLET | Freq: Three times a day (TID) | ORAL | Status: DC | PRN

## 2014-04-25 SURGICAL SUPPLY — 72 items
BANDAGE ELASTIC 4 VELCRO ST LF (GAUZE/BANDAGES/DRESSINGS) ×3 IMPLANT
BANDAGE ESMARK 6X9 LF (GAUZE/BANDAGES/DRESSINGS) ×1 IMPLANT
BANDAGE GAUZE ELAST BULKY 4 IN (GAUZE/BANDAGES/DRESSINGS) ×4 IMPLANT
BLADE 10 SAFETY STRL DISP (BLADE) ×3 IMPLANT
BLADE SAGITTAL 25.0X1.19X90 (BLADE) ×2 IMPLANT
BLADE SAGITTAL 25.0X1.19X90MM (BLADE) ×1
BLADE SURG 10 STRL SS (BLADE) ×2 IMPLANT
BLADE SURG ROTATE 9660 (MISCELLANEOUS) IMPLANT
BNDG CMPR 9X6 STRL LF SNTH (GAUZE/BANDAGES/DRESSINGS) ×1
BNDG CMPR MED 10X6 ELC LF (GAUZE/BANDAGES/DRESSINGS) ×1
BNDG ELASTIC 6X10 VLCR STRL LF (GAUZE/BANDAGES/DRESSINGS) ×3 IMPLANT
BNDG ESMARK 6X9 LF (GAUZE/BANDAGES/DRESSINGS) ×3
BOWL SMART MIX CTS (DISPOSABLE) ×3 IMPLANT
CAPT RP KNEE ×2 IMPLANT
CEMENT HV SMART SET (Cement) ×6 IMPLANT
COVER SURGICAL LIGHT HANDLE (MISCELLANEOUS) ×3 IMPLANT
CUFF TOURNIQUET SINGLE 34IN LL (TOURNIQUET CUFF) ×3 IMPLANT
CUFF TOURNIQUET SINGLE 44IN (TOURNIQUET CUFF) IMPLANT
DRAPE EXTREMITY T 121X128X90 (DRAPE) ×3 IMPLANT
DRAPE PROXIMA HALF (DRAPES) ×3 IMPLANT
DRAPE U-SHAPE 47X51 STRL (DRAPES) ×3 IMPLANT
DRSG ADAPTIC 3X8 NADH LF (GAUZE/BANDAGES/DRESSINGS) ×3 IMPLANT
DRSG PAD ABDOMINAL 8X10 ST (GAUZE/BANDAGES/DRESSINGS) ×3 IMPLANT
DURAPREP 26ML APPLICATOR (WOUND CARE) ×3 IMPLANT
ELECT REM PT RETURN 9FT ADLT (ELECTROSURGICAL) ×3
ELECTRODE REM PT RTRN 9FT ADLT (ELECTROSURGICAL) ×1 IMPLANT
FACESHIELD WRAPAROUND (MASK) IMPLANT
FACESHIELD WRAPAROUND OR TEAM (MASK) ×2 IMPLANT
GLOVE BIO SURGEON STRL SZ8.5 (GLOVE) IMPLANT
GLOVE BIOGEL PI IND STRL 7.0 (GLOVE) IMPLANT
GLOVE BIOGEL PI IND STRL 8 (GLOVE) ×2 IMPLANT
GLOVE BIOGEL PI IND STRL 8.5 (GLOVE) IMPLANT
GLOVE BIOGEL PI INDICATOR 7.0 (GLOVE) ×2
GLOVE BIOGEL PI INDICATOR 8 (GLOVE) ×6
GLOVE BIOGEL PI INDICATOR 8.5 (GLOVE)
GLOVE SS BIOGEL STRL SZ 8 (GLOVE) ×2 IMPLANT
GLOVE SUPERSENSE BIOGEL SZ 8 (GLOVE) ×4
GLOVE SURG SS PI 7.0 STRL IVOR (GLOVE) ×2 IMPLANT
GLOVE SURG SS PI 8.0 STRL IVOR (GLOVE) ×2 IMPLANT
GOWN STRL REUS W/ TWL LRG LVL3 (GOWN DISPOSABLE) ×1 IMPLANT
GOWN STRL REUS W/ TWL XL LVL3 (GOWN DISPOSABLE) ×1 IMPLANT
GOWN STRL REUS W/TWL 2XL LVL3 (GOWN DISPOSABLE) ×3 IMPLANT
GOWN STRL REUS W/TWL LRG LVL3 (GOWN DISPOSABLE)
GOWN STRL REUS W/TWL XL LVL3 (GOWN DISPOSABLE) ×9
HANDPIECE INTERPULSE COAX TIP (DISPOSABLE) ×3
HOOD PEEL AWAY FACE SHEILD DIS (HOOD) ×7 IMPLANT
IMMOBILIZER KNEE 20 (SOFTGOODS) IMPLANT
IMMOBILIZER KNEE 22 UNIV (SOFTGOODS) ×3 IMPLANT
IMMOBILIZER KNEE 24 THIGH 36 (MISCELLANEOUS) IMPLANT
IMMOBILIZER KNEE 24 UNIV (MISCELLANEOUS)
KIT BASIN OR (CUSTOM PROCEDURE TRAY) ×3 IMPLANT
KIT ROOM TURNOVER OR (KITS) ×3 IMPLANT
MANIFOLD NEPTUNE II (INSTRUMENTS) ×3 IMPLANT
NDL HYPO 21X1 ECLIPSE (NEEDLE) ×1 IMPLANT
NEEDLE HYPO 21X1 ECLIPSE (NEEDLE) IMPLANT
NS IRRIG 1000ML POUR BTL (IV SOLUTION) ×3 IMPLANT
PACK TOTAL JOINT (CUSTOM PROCEDURE TRAY) ×3 IMPLANT
PAD ARMBOARD 7.5X6 YLW CONV (MISCELLANEOUS) ×6 IMPLANT
SET HNDPC FAN SPRY TIP SCT (DISPOSABLE) ×1 IMPLANT
SPONGE GAUZE 4X4 12PLY (GAUZE/BANDAGES/DRESSINGS) ×3 IMPLANT
STAPLER VISISTAT 35W (STAPLE) IMPLANT
SUCTION FRAZIER TIP 10 FR DISP (SUCTIONS) ×2 IMPLANT
SUT MNCRL AB 3-0 PS2 18 (SUTURE) IMPLANT
SUT VIC AB 0 CT1 27 (SUTURE) ×6
SUT VIC AB 0 CT1 27XBRD ANBCTR (SUTURE) ×2 IMPLANT
SUT VIC AB 2-0 CT1 27 (SUTURE) ×6
SUT VIC AB 2-0 CT1 TAPERPNT 27 (SUTURE) ×2 IMPLANT
SUT VLOC 180 0 24IN GS25 (SUTURE) ×3 IMPLANT
SYR 50ML LL SCALE MARK (SYRINGE) ×3 IMPLANT
TOWEL OR 17X24 6PK STRL BLUE (TOWEL DISPOSABLE) ×3 IMPLANT
TOWEL OR 17X26 10 PK STRL BLUE (TOWEL DISPOSABLE) ×3 IMPLANT
TRAY FOLEY CATH 14FR (SET/KITS/TRAYS/PACK) ×1 IMPLANT

## 2014-04-25 NOTE — Plan of Care (Signed)
Problem: Consults Goal: Diagnosis- Total Joint Replacement Primary Total Knee Right     

## 2014-04-25 NOTE — Op Note (Signed)
PREOP DIAGNOSIS: DJD RIGHT KNEE POSTOP DIAGNOSIS: same PROCEDURE: RIGHT TKR ANESTHESIA: General and block ATTENDING SURGEON: Hessie Dibble ASSISTANT: Loni Dolly PA  INDICATIONS FOR PROCEDURE: Marissa Gallegos is a 73 y.o. female who has struggled for a long time with pain due to degenerative arthritis of the right knee.  The patient has failed many conservative non-operative measures and at this point has pain which limits the ability to sleep and walk.  The patient is offered total knee replacement.  Informed operative consent was obtained after discussion of possible risks of anesthesia, infection, neurovascular injury, DVT, and death.  The importance of the post-operative rehabilitation protocol to optimize result was stressed extensively with the patient.  SUMMARY OF FINDINGS AND PROCEDURE:  Marissa Gallegos was taken to the operative suite where under the above anesthesia a right knee replacement was performed.  There were advanced degenerative changes and the bone quality was excellent.  We used the DePuy system and placed size standard plus femur, 4 tibia, 38 mm all polyethylene patella, and a size 10 mm spacer.  Loni Dolly PA-C assisted throughout and was invaluable to the completion of the case in that he helped retract and maintain exposure while I placed components.  He also helped close thereby minimizing OR time.  The patient was admitted for appropriate post-op care to include perioperative antibiotics and mechanical and pharmacologic measures for DVT prophylaxis.  DESCRIPTION OF PROCEDURE:  Marissa Gallegos was taken to the operative suite where the above anesthesia was applied.  The patient was positioned supine and prepped and draped in normal sterile fashion.  An appropriate time out was performed.  After the administration of Kefzol and Vancomycin pre-op antibiotic the leg was elevated and exsanguinated and a tourniquet inflated. A standard longitudinal incision was made on the anterior  knee.  Dissection was carried down to the extensor mechanism.  All appropriate anti-infective measures were used including the pre-operative antibiotic, betadine impregnated drape, and closed hooded exhaust systems for each member of the surgical team.  A medial parapatellar incision was made in the extensor mechanism and the knee cap flipped and the knee flexed.  Some residual meniscal tissues were removed along with any remaining ACL/PCL tissue.  A guide was placed on the tibia and a flat cut was made on it's superior surface.  An intramedullary guide was placed in the femur and was utilized to make anterior and posterior cuts creating an appropriate flexion gap.  A second intramedullary guide was placed in the femur to make a distal cut properly balancing the knee with an extension gap equal to the flexion gap.  The three bones sized to the above mentioned sizes and the appropriate guides were placed and utilized.  A trial reduction was done and the knee easily came to full extension and the patella tracked well on flexion.  The trial components were removed and all bones were cleaned with pulsatile lavage and then dried thoroughly.  Cement was mixed and was pressurized onto the bones followed by placement of the aforementioned components.  Excess cement was trimmed and pressure was held on the components until the cement had hardened.  The tourniquet was deflated and a small amount of bleeding was controlled with cautery and pressure.  The knee was irrigated thoroughly.  The extensor mechanism was re-approximated with V-loc suture in running fashion.  The knee was flexed and the repair was solid.  The subcutaneous tissues were re-approximated with #0 and #2-0 vicryl and the skin closed with a  subcuticular stitch and steristrips.  A sterile dressing was applied.  Intraoperative fluids, EBL, and tourniquet time can be obtained from anesthesia records.  DISPOSITION:  The patient was taken to recovery room in  stable condition and admitted for appropriate post-op care to include peri-operative antibiotic and DVT prophylaxis with mechanical and pharmacologic measures.  Marissa Gallegos 04/25/2014, 9:03 AM

## 2014-04-25 NOTE — Transfer of Care (Signed)
Immediate Anesthesia Transfer of Care Note  Patient: Marissa Gallegos  Procedure(s) Performed: Procedure(s): TOTAL KNEE ARTHROPLASTY (Right)  Patient Location: PACU  Anesthesia Type:General  Level of Consciousness: awake, alert  and oriented  Airway & Oxygen Therapy: Patient Spontanous Breathing and Patient connected to nasal cannula oxygen  Post-op Assessment: Report given to PACU RN and Post -op Vital signs reviewed and stable  Post vital signs: Reviewed and stable  Complications: No apparent anesthesia complications

## 2014-04-25 NOTE — Progress Notes (Signed)
Orthopedic Tech Progress Note Patient Details:  Marissa Gallegos 1941-02-01 [id number]  CPM Right Knee CPM Right Knee: On Right Knee Flexion (Degrees): 60 Right Knee Extension (Degrees): 0 Additional Comments: Trapeze bar   Theodoro Parma Cammer 04/25/2014, 10:13 AM

## 2014-04-25 NOTE — Evaluation (Signed)
Physical Therapy Evaluation Patient Details Name: Marissa Gallegos MRN: [id number] DOB: 09-17-1941 Today's Date: 04/25/2014   History of Present Illness  73 y/o female with h/o GERD, HTN and vertigo admitted for right TKA.  Clinical Impression  Patient presents with decreased independence with mobility due to deficits listed in PT problem list.  She will benefit from skilled PT in the acute setting to allow return home with family assist and HHPT.    Follow Up Recommendations Home health PT;Supervision/Assistance - 24 hour    Equipment Recommendations  None recommended by PT    Recommendations for Other Services       Precautions / Restrictions Precautions Precautions: Fall Required Braces or Orthoses: Knee Immobilizer - Right Restrictions Weight Bearing Restrictions: Yes RLE Weight Bearing: Weight bearing as tolerated      Mobility  Bed Mobility Overal bed mobility: Needs Assistance Bed Mobility: Supine to Sit     Supine to sit: Min assist     General bed mobility comments: for right LE, cues for technique  Transfers Overall transfer level: Needs assistance Equipment used: Rolling walker (2 wheeled) Transfers: Sit to/from Stand Sit to Stand: Min assist         General transfer comment: assist for steadying and cues for technique for right LE management  Ambulation/Gait Ambulation/Gait assistance: Min assist Ambulation Distance (Feet): 40 Feet Assistive device: Rolling walker (2 wheeled) Gait Pattern/deviations: Step-to pattern;Antalgic;Decreased stride length;Decreased step length - left     General Gait Details: cues for sequence and technique  Stairs            Wheelchair Mobility    Modified Rankin (Stroke Patients Only)       Balance Overall balance assessment: Needs assistance         Standing balance support: Bilateral upper extremity supported Standing balance-Leahy Scale: Poor Standing balance comment: UE assist for balance                              Pertinent Vitals/Pain Right knee 6/10    Home Living Family/patient expects to be discharged to:: Private residence Living Arrangements: Spouse/significant other Available Help at Discharge: Family;Available 24 hours/day Type of Home: House Home Access: Stairs to enter Entrance Stairs-Rails: None Entrance Stairs-Number of Steps: 2 Home Layout: One level Home Equipment: Walker - 2 wheels;Bedside commode;Other (comment) (CPM)      Prior Function Level of Independence: Independent               Hand Dominance        Extremity/Trunk Assessment               Lower Extremity Assessment: RLE deficits/detail RLE Deficits / Details: AAROM grossly -5 to 65 degrees       Communication   Communication: No difficulties  Cognition Arousal/Alertness: Awake/alert Behavior During Therapy: Anxious Overall Cognitive Status: Within Functional Limits for tasks assessed                      General Comments      Exercises Total Joint Exercises Ankle Circles/Pumps: AROM;Both;10 reps;Supine Quad Sets: AROM;Both;10 reps;Supine Heel Slides: AAROM;Right;10 reps;Supine      Assessment/Plan    PT Assessment Patient needs continued PT services  PT Diagnosis Difficulty walking;Acute pain   PT Problem List Decreased strength;Decreased range of motion;Decreased activity tolerance;Decreased balance;Pain;Decreased mobility;Decreased knowledge of use of DME;Decreased knowledge of precautions  PT Treatment Interventions DME instruction;Therapeutic exercise;Gait training;Balance training;Functional  mobility training;Cognitive remediation;Stair training;Therapeutic activities;Patient/family education   PT Goals (Current goals can be found in the Care Plan section) Acute Rehab PT Goals Patient Stated Goal: To go home PT Goal Formulation: With patient/family Time For Goal Achievement: 05/02/14 Potential to Achieve Goals: Good    Frequency  Min 6X/week   Barriers to discharge        Co-evaluation               End of Session Equipment Utilized During Treatment: Gait belt;Right knee immobilizer Activity Tolerance: Patient tolerated treatment well Patient left: with call bell/phone within reach;with family/visitor present           Time: 1435-1500 PT Time Calculation (min): 25 min   Charges:   PT Evaluation $Initial PT Evaluation Tier I: 1 Procedure PT Treatments $Gait Training: 8-22 mins   PT G CodesMax Sane 04/25/2014, 3:08 PM Magda Kiel, [place] 04/25/2014

## 2014-04-25 NOTE — Anesthesia Postprocedure Evaluation (Signed)
  Anesthesia Post-op Note  Patient: Marissa Gallegos  Procedure(s) Performed: Procedure(s): TOTAL KNEE ARTHROPLASTY (Right)  Patient Location: PACU  Anesthesia Type:General  Level of Consciousness: awake  Airway and Oxygen Therapy: Patient Spontanous Breathing  Post-op Pain: 1 /10, mild  Post-op Assessment: Post-op Vital signs reviewed  Post-op Vital Signs: Reviewed  Last Vitals:  Filed Vitals:   04/25/14 1100  BP: 135/56  Pulse: 81  Temp: 36.6 C  Resp: 14    Complications: No apparent anesthesia complications

## 2014-04-25 NOTE — Care Management Note (Signed)
CARE MANAGEMENT NOTE 04/25/2014  Patient:  Marissa Gallegos, Marissa Gallegos   Account Number:  [id number]  Date Initiated:  04/25/2014  Documentation initiated by:  Ricki Miller  Subjective/Objective Assessment:   73 yr old female s/p right total knee arthroplasty.     Action/Plan:   PT/OT to eval. Case manager will continue to monitor.   Anticipated DC Date:     Anticipated DC Plan:           Choice offered to / List presented to:             Status of service:  In process, will continue to follow

## 2014-04-25 NOTE — Anesthesia Procedure Notes (Addendum)
Anesthesia Regional Block:  Femoral nerve block  Pre-Anesthetic Checklist: ,, timeout performed, Correct Patient, Correct Site, Correct Laterality, Correct Procedure, Correct Position, site marked, Risks and benefits discussed,  Surgical consent,  Pre-op evaluation,  At surgeon's request and post-op pain management  Laterality: Right  Prep: chloraprep       Needles:  Injection technique: Single-shot  Needle Type: Stimulator Needle - 80          Additional Needles:  Procedures: Doppler guided and ultrasound guided (picture in chart) Femoral nerve block  Nerve Stimulator or Paresthesia:  Response: 0.5 mA,   Additional Responses:   Narrative:  Start time: 04/25/2014 7:00 AM End time: 04/25/2014 7:15 AM Injection made incrementally with aspirations every 5 mL.  Performed by: Personally  Anesthesiologist: Dr. Oletta Lamas   Procedure Name: Intubation Date/Time: 04/25/2014 7:38 AM Performed by: Kyung Rudd Pre-anesthesia Checklist: Patient identified, Emergency Drugs available, Suction available, Patient being monitored and Timeout performed Patient Re-evaluated:Patient Re-evaluated prior to inductionOxygen Delivery Method: Circle system utilized Preoxygenation: Pre-oxygenation with 100% oxygen Intubation Type: IV induction Ventilation: Mask ventilation without difficulty Laryngoscope Size: Mac and 3 Grade View: Grade I Tube type: Oral Tube size: 7.5 mm Number of attempts: 1 Airway Equipment and Method: Stylet Placement Confirmation: ETT inserted through vocal cords under direct vision,  positive ETCO2 and breath sounds checked- equal and bilateral Secured at: 22 cm Tube secured with: Tape Dental Injury: Teeth and Oropharynx as per pre-operative assessment

## 2014-04-25 NOTE — Anesthesia Preprocedure Evaluation (Addendum)
Anesthesia Evaluation  Patient identified by MRN, date of birth, ID band Patient awake    Reviewed: Allergy & Precautions, H&P , NPO status , Patient's Chart, lab work & pertinent test results  Airway Mallampati: I      Dental  (+) Teeth Intact   Pulmonary neg pulmonary ROS,          Cardiovascular hypertension, Pt. on medications     Neuro/Psych    GI/Hepatic Neg liver ROS, GERD-  Medicated and Controlled,  Endo/Other    Renal/GU negative Renal ROS     Musculoskeletal   Abdominal   Peds  Hematology   Anesthesia Other Findings   Reproductive/Obstetrics                        Anesthesia Physical Anesthesia Plan  ASA: III  Anesthesia Plan: General   Post-op Pain Management:    Induction: Intravenous  Airway Management Planned: Oral ETT  Additional Equipment:   Intra-op Plan:   Post-operative Plan: Extubation in OR  Informed Consent:   Dental advisory given  Plan Discussed with: CRNA and Anesthesiologist  Anesthesia Plan Comments:        Anesthesia Quick Evaluation

## 2014-04-25 NOTE — Evaluation (Signed)
Occupational Therapy Evaluation Patient Details Name: Marissa Gallegos MRN: [id number] DOB: September 07, 1941 Today's Date: 04/25/2014    History of Present Illness 73 y/o female with h/o GERD, HTN and vertigo admitted for right TKA.   Clinical Impression   Pt presents with below problem list. Pt independent with ADLs, PTA. Feel pt will benefit from acute OT to increase independence prior to d/c.       Follow Up Recommendations  No OT follow up;Supervision - Intermittent    Equipment Recommendations  None recommended by OT    Recommendations for Other Services       Precautions / Restrictions Precautions Precautions: Fall Required Braces or Orthoses: Knee Immobilizer - Right Restrictions Weight Bearing Restrictions: Yes RLE Weight Bearing: Weight bearing as tolerated      Mobility  Transfers Overall transfer level: Needs assistance Equipment used: Rolling walker (2 wheeled) Transfers: Sit to/from Stand Sit to Stand: Min guard         General transfer comment: cues for technique.         ADL Overall ADL's : Needs assistance/impaired     Grooming: Wash/dry hands;Min guard;Standing;Supervision/safety               Lower Body Dressing: Min guard;Sit to/from stand   Toilet Transfer: Ambulation;RW;Minimal assistance (3 in 1 over commode)   Toileting- Clothing Manipulation and Hygiene: Min guard;Sit to/from stand       Functional mobility during ADLs: Minimal assistance;Rolling walker General ADL Comments: Educated on use of bag on walker, safe shoewear and to sit for most of dressing. Told pt AE is available if LB dressing is more difficult later. Educated on shower transfer technique. Educated on dressing technique.  Educated on benefit of reaching down to don/doff right sock as it increases ROM in knee.     Vision                     Perception     Praxis      Pertinent Vitals/Pain Pain 1/10. Repositioned.      Hand Dominance      Extremity/Trunk Assessment Upper Extremity Assessment Upper Extremity Assessment: Overall WFL for tasks assessed   Lower Extremity Assessment Lower Extremity Assessment: Defer to PT evaluation RLE Deficits / Details: AAROM grossly -5 to 65 degrees       Communication Communication Communication: No difficulties   Cognition Arousal/Alertness: Awake/alert Behavior During Therapy: Anxious Overall Cognitive Status: Within Functional Limits for tasks assessed                     General Comments       Exercises       Shoulder Instructions      Home Living Family/patient expects to be discharged to:: Private residence Living Arrangements: Spouse/significant other Available Help at Discharge: Family;Available 24 hours/day Type of Home: House Home Access: Stairs to enter [employer] of Steps: 2 Entrance Stairs-Rails: None Home Layout: One level     Bathroom Shower/Tub: Occupational psychologist:  (has BSC)     Home Equipment: Environmental consultant - 2 wheels;Bedside commode;Other (comment);Shower seat - built in (CPM)          Prior Functioning/Environment Level of Independence: Independent             OT Diagnosis: Acute pain   OT Problem List: Decreased strength;Decreased knowledge of use of DME or AE;Decreased knowledge of precautions;Increased edema;Decreased range of motion   OT Treatment/Interventions: Self-care/ADL training;DME and/or AE  instruction;Therapeutic activities;Patient/family education;Balance training    OT Goals(Current goals can be found in the care plan section) Acute Rehab OT Goals Patient Stated Goal: not stated OT Goal Formulation: With patient Time For Goal Achievement: 05/02/14 Potential to Achieve Goals: Good ADL Goals Pt Will Perform Lower Body Dressing: sit to/from stand;with modified independence Pt Will Transfer to Toilet: with modified independence;ambulating (3 in 1 over commode) Pt Will Perform Toileting -  Clothing Manipulation and hygiene: with modified independence;sit to/from stand Pt Will Perform Tub/Shower Transfer: Shower transfer;with supervision;ambulating;shower seat;rolling walker  OT Frequency: Min 2X/week   Barriers to D/C:            Co-evaluation              End of Session Equipment Utilized During Treatment: Gait belt;Rolling walker;Right knee immobilizer CPM Right Knee CPM Right Knee: Off  Activity Tolerance: Patient tolerated treatment well Patient left: in chair;with call bell/phone within reach;with family/visitor present   Time: 0630-1601 OT Time Calculation (min): 32 min Charges:  OT General Charges $OT Visit: 1 Procedure OT Evaluation $Initial OT Evaluation Tier I: 1 Procedure OT Treatments $Self Care/Home Management : 8-22 mins G-Codes:    Benito Mccreedy OTR/L [phone] 04/25/2014, 5:31 PM

## 2014-04-25 NOTE — Progress Notes (Signed)
Dr Oletta Lamas informed of unable to remove  Ring.

## 2014-04-25 NOTE — Progress Notes (Signed)
Utilization review completed.  

## 2014-04-25 NOTE — Interval H&P Note (Signed)
History and Physical Interval Note:  04/25/2014 7:26 AM  Marissa Gallegos  has presented today for surgery, with the diagnosis of RIGHT KNEE DEGENERATIVE JOINT DISEASE  The various methods of treatment have been discussed with the patient and family. After consideration of risks, benefits and other options for treatment, the patient has consented to  Procedure(s): TOTAL KNEE ARTHROPLASTY (Right) as a surgical intervention .  The patient's history has been reviewed, patient examined, no change in status, stable for surgery.  I have reviewed the patient's chart and labs.  Questions were answered to the patient's satisfaction.     Hessie Dibble

## 2014-04-25 NOTE — Progress Notes (Signed)
Unable to remove gold colored wedding band. Pt to let the Dr know.

## 2014-04-26 LAB — BASIC METABOLIC PANEL
BUN: 11 mg/dL (ref 6–23)
CHLORIDE: 105 meq/L (ref 96–112)
CO2: 26 mEq/L (ref 19–32)
Calcium: 9.8 mg/dL (ref 8.4–10.5)
Creatinine, Ser: 0.6 mg/dL (ref 0.50–1.10)
GFR calc Af Amer: >90 mL/min (ref >90)
GFR, EST NON AFRICAN AMERICAN: 90 mL/min — AB (ref >90)
Glucose, Bld: 140 mg/dL — ABNORMAL HIGH (ref 70–99)
POTASSIUM: 3.3 meq/L — AB (ref 3.7–5.3)
SODIUM: 141 meq/L (ref 137–147)

## 2014-04-26 LAB — CBC
HEMATOCRIT: 32.5 % — AB (ref 36.0–46.0)
HEMOGLOBIN: 10.8 g/dL — AB (ref 12.0–15.0)
MCH: 30.3 pg (ref 26.0–34.0)
MCHC: 33.2 g/dL (ref 30.0–36.0)
MCV: 91 fL (ref 78.0–100.0)
Platelets: 231 10*3/uL (ref 150–400)
RBC: 3.57 MIL/uL — ABNORMAL LOW (ref 3.87–5.11)
RDW: 13.5 % (ref 11.5–15.5)
WBC: 7.7 10*3/uL (ref 4.0–10.5)

## 2014-04-26 MED ORDER — VANCOMYCIN HCL IN DEXTROSE 1-5 GM/200ML-% IV SOLN
1000.0000 mg | Freq: Two times a day (BID) | INTRAVENOUS | Status: DC
  Filled 2014-04-26 (×2): qty 200

## 2014-04-26 MED ORDER — VANCOMYCIN HCL 1000 MG IV SOLR
1000.0000 mg | Freq: Once | INTRAVENOUS | Status: AC
  Administered 2014-04-26: 1000 mg via INTRAVENOUS
  Filled 2014-04-26: qty 1000

## 2014-04-26 NOTE — Plan of Care (Signed)
Problem: Consults Goal: Diagnosis- Total Joint Replacement Primary Total Knee Right     

## 2014-04-26 NOTE — Progress Notes (Signed)
Occupational Therapy Treatment Patient Details Name: Marissa Gallegos MRN: [id number] DOB: [birth year] Today's Date: 04/26/2014    History of present illness 73 y/o female with h/o GERD, HTN and vertigo admitted for right TKA.   OT comments  Practiced shower transfer and LB dressing. Pt moving well during session. Feel pt is safe to d/c home, from OT standpoint, with spouse available to assist.   Follow Up Recommendations  No OT follow up;Supervision - Intermittent    Equipment Recommendations  None recommended by OT    Recommendations for Other Services      Precautions / Restrictions Precautions Precautions: Fall Required Braces or Orthoses: Knee Immobilizer - Right Restrictions Weight Bearing Restrictions: Yes RLE Weight Bearing: Weight bearing as tolerated       Mobility Bed Mobility Overal bed mobility: Needs Assistance Bed Mobility: Supine to Sit     Supine to sit: Supervision     General bed mobility comments: Told pt she could hook other foot around LLE to assist. Pt able to move EOB without doing so with extra time. Practiced without rail.  Transfers Overall transfer level: Needs assistance Equipment used: Rolling walker (2 wheeled) Transfers: Sit to/from Stand Sit to Stand: Supervision;Min guard         General transfer comment: cues for hand placement    Balance                                   ADL Overall ADL's : Needs assistance/impaired                     Lower Body Dressing: Supervision/safety;Sitting/lateral leans (right sock)   Toilet Transfer: Min guard;Ambulation;RW (3 in 1 over commode)   Toileting- Clothing Manipulation and Hygiene: Supervision/safety (standing)   Tub/ Shower Transfer: Min guard;Ambulation;Shower seat;Rolling walker   Functional mobility during ADLs: Min guard;Rolling walker General ADL Comments: Practiced shower transfer. Reviewed safety tips. Pt able to don/doff right sock today.       Vision                     Perception     Praxis      Cognition   Behavior During Therapy: Anxious Overall Cognitive Status: Within Functional Limits for tasks assessed                       Extremity/Trunk Assessment                  Shoulder Instructions       General Comments      Pertinent Vitals/ Pain       Pain 1/10. Increased activity during session.   Home Living                                          Prior Functioning/Environment              Frequency Min 2X/week     Progress Toward Goals  OT Goals(current goals can now be found in the care plan section)  Progress towards OT goals: Progressing toward goals  Acute Rehab OT Goals Patient Stated Goal: not stated OT Goal Formulation: With patient Time For Goal Achievement: 05/02/14 Potential to Achieve Goals: Good ADL Goals Pt Will Perform Lower Body Dressing: sit to/from stand;with  modified independence Pt Will Transfer to Toilet: with modified independence;ambulating (3 in 1 over commode) Pt Will Perform Toileting - Clothing Manipulation and hygiene: with modified independence;sit to/from stand Pt Will Perform Tub/Shower Transfer: Shower transfer;with supervision;ambulating;shower seat;rolling walker  Plan Discharge plan remains appropriate    Co-evaluation                 End of Session Equipment Utilized During Treatment: Gait belt;Rolling walker;Right knee immobilizer   Activity Tolerance Patient tolerated treatment well   Patient Left Other (comment) (on toilet-increased time to [facility] tech aware)   Nurse Communication          Time: 1607-3710 OT Time Calculation (min): 22 min  Charges: OT General Charges $OT Visit: 1 Procedure OT Treatments $Self Care/Home Management : 8-22 mins  Benito Mccreedy OTR/L [phone] 04/26/2014, 12:50 PM

## 2014-04-26 NOTE — Progress Notes (Signed)
Physical Therapy Treatment Patient Details Name: Marissa Gallegos MRN: [id number] DOB: 12-05-1941 Today's Date: 04/26/2014    History of Present Illness 73 y/o female with h/o GERD, HTN and vertigo admitted for right TKA.    PT Comments    Patient demonstrates more self confidence with gait training this session. Patient continues to progress towards goals with increased gait training and stairs today. Patient requested her husband demonstrate donning/doffing knee immobilizer and was able to perform with Min A for proper technique. Continue plan of care with d/c HHPT tomorrow with supervision for safety.   Follow Up Recommendations  Home health PT;Supervision/Assistance - 24 hour     Equipment Recommendations  None recommended by PT    Recommendations for Other Services       Precautions / Restrictions Precautions Precautions: Fall Required Braces or Orthoses: Knee Immobilizer - Right Restrictions Weight Bearing Restrictions: Yes RLE Weight Bearing: Weight bearing as tolerated    Mobility  Bed Mobility Overal bed mobility: Needs Assistance Bed Mobility: Supine to Sit;Sit to Supine     Supine to sit: Supervision     General bed mobility comments: patient able to demonstrate bed mobility without use of rail.  Transfers Overall transfer level: Needs assistance Equipment used: Rolling walker (2 wheeled) Transfers: Sit to/from Stand Sit to Stand: Min guard         General transfer comment: patient able to sit to stand x 3  Ambulation/Gait Ambulation/Gait assistance: Min guard Ambulation Distance (Feet): 200 Feet Assistive device: Rolling walker (2 wheeled) Gait Pattern/deviations: Step-through pattern;Decreased stride length     General Gait Details: patient less anxious with gait training and able to increase pace last 75'   Stairs Stairs: Yes Stairs assistance: Min assist Stair Management: One rail Left Number of Stairs: 2 General stair comments: Patient  able to ascend/ descend stairs with cues for safe technique. Post treatment pt able to recall proper sequencing.   Wheelchair Mobility    Modified Rankin (Stroke Patients Only)       Balance                                    Cognition Arousal/Alertness: Awake/alert Behavior During Therapy: Anxious Overall Cognitive Status: Within Functional Limits for tasks assessed                      Exercises Total Joint Exercises Ankle Circles/Pumps: AROM;Both;10 reps;Supine Quad Sets: AROM;Both;10 reps;Supine Heel Slides: Right;10 reps;Supine;AROM    General Comments        Pertinent Vitals/Pain Patient reports 1/10 pain level post PT. Patient repositioned for comfort.     Home Living                      Prior Function            PT Goals (current goals can now be found in the care plan section) Acute Rehab PT Goals Patient Stated Goal: not stated Progress towards PT goals: Progressing toward goals    Frequency  Min 6X/week    PT Plan Current plan remains appropriate    Co-evaluation             End of Session Equipment Utilized During Treatment: Gait belt;Right knee immobilizer Activity Tolerance: Patient tolerated treatment well Patient left: with call bell/phone within reach;in bed;with family/visitor present     Time: 5176-1607 PT Time Calculation (min): 32  min  Charges:  $Gait Training: 23-37 mins $Therapeutic Exercise: 8-22 mins                    G Codes:      [place], SPTA 04/26/2014, 1:44 PM

## 2014-04-26 NOTE — Progress Notes (Signed)
Seen and agreed 04/26/2014 [place] PTA [phone] pager [phone] office

## 2014-04-26 NOTE — Progress Notes (Signed)
Subjective: 1 Day Post-Op Procedure(s) (LRB): TOTAL KNEE ARTHROPLASTY (Right)  Activity level:  wbat Diet tolerance:  Eating well Voiding:  ok Patient reports pain as 1 on 0-10 scale.    Objective: Vital signs in last 24 hours: Temp:  [97.9 F (36.6 C)-99.1 F (37.3 C)] 97.9 F (36.6 C) (04/29 0447) Pulse Rate:  [77-94] 77 (04/29 0447) Resp:  [13-26] 18 (04/29 0447) BP: (124-141)/(51-63) 131/53 mmHg (04/29 0447) SpO2:  [94 %-99 %] 94 % (04/29 0447)  Labs:  Recent Labs  04/26/14 0415  HGB 10.8*    Recent Labs  04/26/14 0415  WBC 7.7  RBC 3.57*  HCT 32.5*  PLT 231    Recent Labs  04/26/14 0415  NA 141  K 3.3*  CL 105  CO2 26  BUN 11  CREATININE 0.60  GLUCOSE 140*  CALCIUM 9.8   No results found for this basename: LABPT, INR,  in the last 72 hours  Physical Exam:  Neurologically intact ABD soft Neurovascular intact Sensation intact distally Dorsiflexion/Plantar flexion intact Incision: dressing C/D/I No cellulitis present  Assessment/Plan:  1 Day Post-Op Procedure(s) (LRB): TOTAL KNEE ARTHROPLASTY (Right) Advance diet Up with therapy D/C IV fluids Plan for discharge tomorrow Discharge home with home health Continue ASA 325mg  bid x 2 weeks Follow up in 2 weeks  Dressing change to mepilex. Stop using foam footsie.    Marissa Gallegos Marissa Gallegos 04/26/2014, 8:05 AM

## 2014-04-26 NOTE — Progress Notes (Signed)
Physical Therapy Treatment Patient Details Name: Marissa Gallegos MRN: [id number] DOB: [birth year] Today's Date: 04/26/2014    History of Present Illness 73 y/o female with h/o GERD, HTN and vertigo admitted for right TKA.    PT Comments    Nurse is room prior to therapy to remove lines and adm medication. Patient is anxious with therapy and requires reassurance throughout session despite being able to tolerate increased gait training with good technique. Patient still requires knee immobilizer with gait training. Continue to progress towards goals. Attempt stair training next PT. Continue to recommend HHPT at this time with A with mobility and safety.    Follow Up Recommendations  Home health PT;Supervision/Assistance - 24 hour     Equipment Recommendations  None recommended by PT    Recommendations for Other Services       Precautions / Restrictions Precautions Precautions: Fall Required Braces or Orthoses: Knee Immobilizer - Right Restrictions RLE Weight Bearing: Weight bearing as tolerated    Mobility  Bed Mobility Overal bed mobility: Needs Assistance Bed Mobility: Supine to Sit     Supine to sit: Min assist     General bed mobility comments: Patient required min A for LE protection.   Transfers Overall transfer level: Needs assistance Equipment used: Rolling walker (2 wheeled) Transfers: Sit to/from Stand Sit to Stand: Min guard         General transfer comment: cues for technique.  Ambulation/Gait Ambulation/Gait assistance: Min assist Ambulation Distance (Feet): 100 Feet Assistive device: Rolling walker (2 wheeled) Gait Pattern/deviations: Step-to pattern     General Gait Details: weight shifting for WB status prior to gait training. Patient anxious throughout gait training, multiple thoughts and unable to focus.   Stairs            Wheelchair Mobility    Modified Rankin (Stroke Patients Only)       Balance                                     Cognition Arousal/Alertness: Awake/alert Behavior During Therapy: Anxious Overall Cognitive Status: Within Functional Limits for tasks assessed                      Exercises Total Joint Exercises Ankle Circles/Pumps: AROM;Both;10 reps;Supine Quad Sets: AROM;Both;10 reps;Supine Heel Slides: Right;10 reps;Supine;AROM    General Comments        Pertinent Vitals/Pain Patient denies pain.    Home Living                      Prior Function            PT Goals (current goals can now be found in the care plan section) Progress towards PT goals: Progressing toward goals    Frequency  Min 6X/week    PT Plan Current plan remains appropriate    Co-evaluation             End of Session Equipment Utilized During Treatment: Gait belt;Right knee immobilizer Activity Tolerance: Patient tolerated treatment well Patient left: with call bell/phone within reach;in chair     Time: 0815-0905 PT Time Calculation (min): 50 min  Charges:                       G Codes:      [place], SPTA 04/26/2014, 10:03 AM

## 2014-04-26 NOTE — Progress Notes (Signed)
Seen and agreed 04/26/2014 Julia Elizabeth Robinette PTA [phone] pager [phone] office    

## 2014-04-27 ENCOUNTER — Encounter (HOSPITAL_COMMUNITY): Payer: Self-pay | Admitting: Orthopaedic Surgery

## 2014-04-27 LAB — CBC
HEMATOCRIT: 32.4 % — AB (ref 36.0–46.0)
HEMOGLOBIN: 10.6 g/dL — AB (ref 12.0–15.0)
MCH: 30.4 pg (ref 26.0–34.0)
MCHC: 32.7 g/dL (ref 30.0–36.0)
MCV: 92.8 fL (ref 78.0–100.0)
Platelets: 227 10*3/uL (ref 150–400)
RBC: 3.49 MIL/uL — AB (ref 3.87–5.11)
RDW: 13.7 % (ref 11.5–15.5)
WBC: 8.5 10*3/uL (ref 4.0–10.5)

## 2014-04-27 MED ORDER — HYDROCODONE-ACETAMINOPHEN 5-325 MG PO TABS: 1.0000 | ORAL_TABLET | Freq: Four times a day (QID) | ORAL | Status: AC | PRN

## 2014-04-27 MED ORDER — METHOCARBAMOL 500 MG PO TABS: 500.0000 mg | ORAL_TABLET | Freq: Four times a day (QID) | ORAL | Status: DC | PRN

## 2014-04-27 MED ORDER — ASPIRIN 325 MG PO TBEC: 325.0000 mg | DELAYED_RELEASE_TABLET | Freq: Two times a day (BID) | ORAL | Status: AC

## 2014-04-27 NOTE — Progress Notes (Signed)
Seen and agreed 04/27/2014 Julia Elizabeth Robinette PTA [phone] pager [phone] office    

## 2014-04-27 NOTE — Discharge Summary (Signed)
Patient ID: Marissa Gallegos MRN: [id number] DOB/AGE: 72-Dec-1942 72 y.o.  Admit date: 04/25/2014 Discharge date: 04/27/2014  Admission Diagnoses:  Principal Problem:   Right knee DJD   Discharge Diagnoses:  Same  Past Medical History  Diagnosis Date  . GERD (gastroesophageal reflux disease)   . Complication of anesthesia     poss low BP with anesthesia,vagal  . Hypertension     dr r. Kenton Kingfisher  . Vertigo     Surgeries: Procedure(s): TOTAL KNEE ARTHROPLASTY on 04/25/2014   Consultants:    Discharged Condition: Improved  Hospital Course: Marissa Gallegos is an 73 y.o. female who was admitted 04/25/2014 for operative treatment ofRight knee DJD. Patient has severe unremitting pain that affects sleep, daily activities, and work/hobbies. After pre-op clearance the patient was taken to the operating room on 04/25/2014 and underwent  Procedure(s): TOTAL KNEE ARTHROPLASTY.    Patient was given perioperative antibiotics: Anti-infectives   Start     Dose/Rate Route Frequency Ordered Stop   04/26/14 1000  vancomycin (VANCOCIN) 1,000 mg in sodium chloride 0.9 % 250 mL IVPB     1,000 mg 250 mL/hr over 60 Minutes Intravenous  Once 04/26/14 0956 04/26/14 1108   04/26/14 0830  vancomycin (VANCOCIN) IVPB 1000 mg/200 mL premix  Status:  Discontinued     1,000 mg 200 mL/hr over 60 Minutes Intravenous Every 12 hours 04/26/14 0823 04/26/14 0954   04/25/14 2000  vancomycin (VANCOCIN) IVPB 1000 mg/200 mL premix  Status:  Discontinued     1,000 mg 200 mL/hr over 60 Minutes Intravenous  Once 04/25/14 1057 04/25/14 2156   04/25/14 1330  ceFAZolin (ANCEF) IVPB 2 g/50 mL premix     2 g 100 mL/hr over 30 Minutes Intravenous 4 times per day 04/25/14 1057 04/25/14 2106   04/25/14 1000  vancomycin (VANCOCIN) 1,000 mg in sodium chloride 0.9 % 250 mL IVPB  Status:  Discontinued     1,000 mg 250 mL/hr over 60 Minutes Intravenous Every 12 hours 04/25/14 0802 04/26/14 0956   04/25/14 0600  ceFAZolin (ANCEF) IVPB 2  g/50 mL premix     2 g 100 mL/hr over 30 Minutes Intravenous On call to O.R. 04/24/14 1412 04/25/14 0745       Patient was given sequential compression devices, early ambulation, and chemoprophylaxis to prevent DVT.  Patient benefited maximally from hospital stay and there were no complications.    Recent vital signs: Patient Vitals for the past 24 hrs:  BP Temp Temp src Pulse Resp SpO2  04/27/14 0759 - - - - 18 -  04/27/14 0613 137/53 mmHg 98.2 F (36.8 C) Oral 85 16 93 %  04/27/14 0400 - - - - 18 96 %  04/27/14 0000 - - - - 16 95 %  04/26/14 2120 127/55 mmHg 98.8 F (37.1 C) Oral 94 16 94 %  04/26/14 2000 - - - - 96 96 %  04/26/14 1300 132/57 mmHg 99.9 F (37.7 C) - 87 18 97 %     Recent laboratory studies:  Recent Labs  04/26/14 0415 04/27/14 0720  WBC 7.7 8.5  HGB 10.8* 10.6*  HCT 32.5* 32.4*  PLT 231 227  NA 141  --   K 3.3*  --   CL 105  --   CO2 26  --   BUN 11  --   CREATININE 0.60  --   GLUCOSE 140*  --   CALCIUM 9.8  --      Discharge Medications:     Medication  List    STOP taking these medications       diclofenac sodium 1 % Gel  Commonly known as:  VOLTAREN      TAKE these medications       amLODipine 10 MG tablet  Commonly known as:  NORVASC  Take 10 mg by mouth daily.     aspirin 325 MG EC tablet  Take 1 tablet (325 mg total) by mouth 2 (two) times daily after a meal.     CALTRATE 600+D PLUS PO  Take 1 tablet by mouth 2 (two) times daily.     CENTRUM PO  Take 1 tablet by mouth daily.     cloNIDine 0.1 MG tablet  Commonly known as:  CATAPRES  Take 0.1 mg by mouth 2 (two) times daily.     esomeprazole 40 MG capsule  Commonly known as:  NEXIUM  Take 40 mg by mouth 2 (two) times daily before a meal.     hydrochlorothiazide 25 MG tablet  Commonly known as:  HYDRODIURIL  Take 25 mg by mouth daily.     HYDROcodone-acetaminophen 5-325 MG per tablet  Commonly known as:  NORCO/VICODIN  Take 1-2 tablets by mouth every 6 (six)  hours as needed for moderate pain (breakthrough pain).     JINTELI 1-5 MG-MCG Tabs  Generic drug:  norethindrone-ethinyl estradiol  Take 1 tablet by mouth daily.     losartan 100 MG tablet  Commonly known as:  COZAAR  Take 100 mg by mouth daily.     Magnesium 250 MG Tabs  Take 250 mg by mouth 2 (two) times daily.     methocarbamol 500 MG tablet  Commonly known as:  ROBAXIN  Take 1 tablet (500 mg total) by mouth every 6 (six) hours as needed for muscle spasms.     Vitamin D-3 1000 UNITS Caps  Take 1,000-2,000 Units by mouth 2 (two) times daily. 2000 units in the morning and 1000 units in the evening        Diagnostic Studies: Dg Chest 2 View  04/17/2014   CLINICAL DATA:  Preoperative evaluation for knee arthroplasty  EXAM: CHEST  2 VIEW  COMPARISON:  None.  FINDINGS: The heart size and mediastinal contours are within normal limits. Both lungs are clear. The visualized skeletal structures show degenerative change of the thoracic spine.  IMPRESSION: No active cardiopulmonary disease.   Electronically Signed   By: Inez Catalina M.D.   On: 04/17/2014 10:43    Disposition:       Discharge Orders   Future Orders Complete By Expires   Call MD / Call 911  As directed    Constipation Prevention  As directed    Diet - low sodium heart healthy  As directed    Increase activity slowly as tolerated  As directed       Follow-up Information   Follow up with DALLDORF,PETER G, MD. Call in 2 weeks.   Specialty:  Orthopedic Surgery   Contact information:   [place] [address] [phone]        Signed: Larwance Sachs Sabre Leonetti 04/27/2014, 8:40 AM

## 2014-04-27 NOTE — Care Management Note (Addendum)
CARE MANAGEMENT NOTE 04/27/2014  Patient:  Marissa Gallegos, Marissa Gallegos   Account Number:  [id number]  Date Initiated:  04/25/2014  Documentation initiated by:  Ricki Miller  Subjective/Objective Assessment:   73 yr old female s/p right total knee arthroplasty.     Action/Plan:   PT/OT to eval. Case manager will continue to monitor.   Anticipated DC Date:  04/27/2014   Anticipated DC Plan:  [place]  CM consult      [place]   Choice offered to / List presented to:  C-3 Spouse   DME arranged  [place]  3-N-1  CPM      DME agency  [employer]     [place] arranged  HH-2 PT      [place] agency  [place]   Status of service:  Completed, signed off  Comments:  04/27/14  Initial IM letter was signed on 04/17/14 as part of preadmission workup. Ricki Miller, RN BSN Case Manager   04/26/14 Ricki Miller, RN BSN Case Manager [phone] Case manager spoke with Mr. Urda concerning home health and DME , while wife was participating in physical therapy. States they want [facility]. Referral called to Rhett Bannister, [place]. Patient has rolling walker, 3in1 and CPM at the home.

## 2014-04-27 NOTE — Progress Notes (Signed)
Physical Therapy Treatment Patient Details Name: Marissa Gallegos MRN: [id number] DOB: June 10, 1941 Today's Date: 04/27/2014    History of Present Illness 73 y/o female with h/o GERD, HTN and vertigo admitted for right TKA.    PT Comments    Patient continues to progress with goals. Patient able to recall stair training yesterday with proper technique.  With encouragement patient able to tolerate therapeutic exercises to demonstrate no ext lag. Patient able to tolerate gait training without knee immobilizer. PA in room during treatment to discuss progress and clear for d/c home today. Patient has husband support at home for safety. Patient would benefit from HHPT so that she can continue to increase strength and ROM. Continue plan of care.    Follow Up Recommendations  Home health PT;Supervision/Assistance - 24 hour     Equipment Recommendations  None recommended by PT    Recommendations for Other Services       Precautions / Restrictions Precautions Precautions: Fall Restrictions Weight Bearing Restrictions: Yes RLE Weight Bearing: Weight bearing as tolerated    Mobility  Bed Mobility Overal bed mobility: Needs Assistance Bed Mobility: Supine to Sit;Sit to Supine     Supine to sit: Supervision     General bed mobility comments: patient able to demonstrate bed mobility without use of rail. Practice bed mobility x 3  Transfers Overall transfer level: Needs assistance Equipment used: Rolling walker (2 wheeled) Transfers: Sit to/from Stand Sit to Stand: Min guard         General transfer comment: patient able to sit to stand x 3  Ambulation/Gait Ambulation/Gait assistance: Min guard Ambulation Distance (Feet): 200 Feet Assistive device: Rolling walker (2 wheeled) Gait Pattern/deviations: Step-through pattern;Decreased stride length     General Gait Details: Patient able to perform gait training without ext lag and no knee immobilizer    Stairs          General stair comments: Patient able to recall 04/26/14 PT session proper ascending/descending technique.  Wheelchair Mobility    Modified Rankin (Stroke Patients Only)       Balance                                    Cognition Arousal/Alertness: Awake/alert Behavior During Therapy: WFL for tasks assessed/performed Overall Cognitive Status: Within Functional Limits for tasks assessed                      Exercises Total Joint Exercises Ankle Circles/Pumps: AROM;Both;10 reps;Supine Quad Sets: AROM;Both;10 reps;Supine Heel Slides: Right;10 reps;Supine;AROM;Seated Straight Leg Raises: AROM;Right;10 reps;Supine Long Arc Quad: AROM;Seated;Right;5 reps    General Comments        Pertinent Vitals/Pain Patient denies pain.     Home Living                      Prior Function            PT Goals (current goals can now be found in the care plan section) Progress towards PT goals: Progressing toward goals    Frequency  Min 6X/week    PT Plan Current plan remains appropriate    Co-evaluation             End of Session Equipment Utilized During Treatment: Gait belt Activity Tolerance: Patient tolerated treatment well Patient left: with call bell/phone within reach;in bed;with family/visitor present     Time: 0109-3235 PT Time Calculation (  min): 45 min  Charges:                       G Codes:      [place], SPTA 04/27/2014, 9:34 AM

## 2014-04-28 DIAGNOSIS — R269 Unspecified abnormalities of gait and mobility: Secondary | ICD-10-CM | POA: Diagnosis not present

## 2014-04-28 DIAGNOSIS — Z9181 History of falling: Secondary | ICD-10-CM | POA: Diagnosis not present

## 2014-04-28 DIAGNOSIS — IMO0001 Reserved for inherently not codable concepts without codable children: Secondary | ICD-10-CM | POA: Diagnosis not present

## 2014-04-28 DIAGNOSIS — Z471 Aftercare following joint replacement surgery: Secondary | ICD-10-CM | POA: Diagnosis not present

## 2014-04-28 DIAGNOSIS — Z96659 Presence of unspecified artificial knee joint: Secondary | ICD-10-CM | POA: Diagnosis not present

## 2014-04-28 DIAGNOSIS — I10 Essential (primary) hypertension: Secondary | ICD-10-CM | POA: Diagnosis not present

## 2014-05-01 DIAGNOSIS — Z9181 History of falling: Secondary | ICD-10-CM | POA: Diagnosis not present

## 2014-05-01 DIAGNOSIS — Z471 Aftercare following joint replacement surgery: Secondary | ICD-10-CM | POA: Diagnosis not present

## 2014-05-01 DIAGNOSIS — R269 Unspecified abnormalities of gait and mobility: Secondary | ICD-10-CM | POA: Diagnosis not present

## 2014-05-01 DIAGNOSIS — I10 Essential (primary) hypertension: Secondary | ICD-10-CM | POA: Diagnosis not present

## 2014-05-01 DIAGNOSIS — IMO0001 Reserved for inherently not codable concepts without codable children: Secondary | ICD-10-CM | POA: Diagnosis not present

## 2014-05-01 DIAGNOSIS — Z96659 Presence of unspecified artificial knee joint: Secondary | ICD-10-CM | POA: Diagnosis not present

## 2014-05-03 DIAGNOSIS — IMO0001 Reserved for inherently not codable concepts without codable children: Secondary | ICD-10-CM | POA: Diagnosis not present

## 2014-05-03 DIAGNOSIS — Z96659 Presence of unspecified artificial knee joint: Secondary | ICD-10-CM | POA: Diagnosis not present

## 2014-05-03 DIAGNOSIS — I10 Essential (primary) hypertension: Secondary | ICD-10-CM | POA: Diagnosis not present

## 2014-05-03 DIAGNOSIS — R269 Unspecified abnormalities of gait and mobility: Secondary | ICD-10-CM | POA: Diagnosis not present

## 2014-05-03 DIAGNOSIS — Z471 Aftercare following joint replacement surgery: Secondary | ICD-10-CM | POA: Diagnosis not present

## 2014-05-03 DIAGNOSIS — Z9181 History of falling: Secondary | ICD-10-CM | POA: Diagnosis not present

## 2014-05-05 DIAGNOSIS — Z96659 Presence of unspecified artificial knee joint: Secondary | ICD-10-CM | POA: Diagnosis not present

## 2014-05-05 DIAGNOSIS — I10 Essential (primary) hypertension: Secondary | ICD-10-CM | POA: Diagnosis not present

## 2014-05-05 DIAGNOSIS — IMO0001 Reserved for inherently not codable concepts without codable children: Secondary | ICD-10-CM | POA: Diagnosis not present

## 2014-05-05 DIAGNOSIS — R269 Unspecified abnormalities of gait and mobility: Secondary | ICD-10-CM | POA: Diagnosis not present

## 2014-05-05 DIAGNOSIS — Z471 Aftercare following joint replacement surgery: Secondary | ICD-10-CM | POA: Diagnosis not present

## 2014-05-05 DIAGNOSIS — Z9181 History of falling: Secondary | ICD-10-CM | POA: Diagnosis not present

## 2014-05-08 DIAGNOSIS — I10 Essential (primary) hypertension: Secondary | ICD-10-CM | POA: Diagnosis not present

## 2014-05-08 DIAGNOSIS — Z9181 History of falling: Secondary | ICD-10-CM | POA: Diagnosis not present

## 2014-05-08 DIAGNOSIS — R269 Unspecified abnormalities of gait and mobility: Secondary | ICD-10-CM | POA: Diagnosis not present

## 2014-05-08 DIAGNOSIS — IMO0001 Reserved for inherently not codable concepts without codable children: Secondary | ICD-10-CM | POA: Diagnosis not present

## 2014-05-08 DIAGNOSIS — Z471 Aftercare following joint replacement surgery: Secondary | ICD-10-CM | POA: Diagnosis not present

## 2014-05-08 DIAGNOSIS — Z96659 Presence of unspecified artificial knee joint: Secondary | ICD-10-CM | POA: Diagnosis not present

## 2014-05-08 DIAGNOSIS — M1710 Unilateral primary osteoarthritis, unspecified knee: Secondary | ICD-10-CM | POA: Diagnosis not present

## 2014-05-10 DIAGNOSIS — Z471 Aftercare following joint replacement surgery: Secondary | ICD-10-CM | POA: Diagnosis not present

## 2014-05-10 DIAGNOSIS — R269 Unspecified abnormalities of gait and mobility: Secondary | ICD-10-CM | POA: Diagnosis not present

## 2014-05-10 DIAGNOSIS — IMO0001 Reserved for inherently not codable concepts without codable children: Secondary | ICD-10-CM | POA: Diagnosis not present

## 2014-05-10 DIAGNOSIS — Z9181 History of falling: Secondary | ICD-10-CM | POA: Diagnosis not present

## 2014-05-10 DIAGNOSIS — I10 Essential (primary) hypertension: Secondary | ICD-10-CM | POA: Diagnosis not present

## 2014-05-10 DIAGNOSIS — Z96659 Presence of unspecified artificial knee joint: Secondary | ICD-10-CM | POA: Diagnosis not present

## 2014-05-15 DIAGNOSIS — I10 Essential (primary) hypertension: Secondary | ICD-10-CM | POA: Diagnosis not present

## 2014-05-15 DIAGNOSIS — R269 Unspecified abnormalities of gait and mobility: Secondary | ICD-10-CM | POA: Diagnosis not present

## 2014-05-15 DIAGNOSIS — IMO0001 Reserved for inherently not codable concepts without codable children: Secondary | ICD-10-CM | POA: Diagnosis not present

## 2014-05-15 DIAGNOSIS — Z9181 History of falling: Secondary | ICD-10-CM | POA: Diagnosis not present

## 2014-05-15 DIAGNOSIS — Z471 Aftercare following joint replacement surgery: Secondary | ICD-10-CM | POA: Diagnosis not present

## 2014-05-15 DIAGNOSIS — Z96659 Presence of unspecified artificial knee joint: Secondary | ICD-10-CM | POA: Diagnosis not present

## 2014-05-17 DIAGNOSIS — I10 Essential (primary) hypertension: Secondary | ICD-10-CM | POA: Diagnosis not present

## 2014-05-17 DIAGNOSIS — Z9181 History of falling: Secondary | ICD-10-CM | POA: Diagnosis not present

## 2014-05-17 DIAGNOSIS — R269 Unspecified abnormalities of gait and mobility: Secondary | ICD-10-CM | POA: Diagnosis not present

## 2014-05-17 DIAGNOSIS — Z96659 Presence of unspecified artificial knee joint: Secondary | ICD-10-CM | POA: Diagnosis not present

## 2014-05-17 DIAGNOSIS — Z471 Aftercare following joint replacement surgery: Secondary | ICD-10-CM | POA: Diagnosis not present

## 2014-05-17 DIAGNOSIS — IMO0001 Reserved for inherently not codable concepts without codable children: Secondary | ICD-10-CM | POA: Diagnosis not present

## 2014-05-24 DIAGNOSIS — I10 Essential (primary) hypertension: Secondary | ICD-10-CM | POA: Diagnosis not present

## 2014-05-24 DIAGNOSIS — Z9181 History of falling: Secondary | ICD-10-CM | POA: Diagnosis not present

## 2014-05-24 DIAGNOSIS — R269 Unspecified abnormalities of gait and mobility: Secondary | ICD-10-CM | POA: Diagnosis not present

## 2014-05-24 DIAGNOSIS — Z471 Aftercare following joint replacement surgery: Secondary | ICD-10-CM | POA: Diagnosis not present

## 2014-05-24 DIAGNOSIS — Z96659 Presence of unspecified artificial knee joint: Secondary | ICD-10-CM | POA: Diagnosis not present

## 2014-05-24 DIAGNOSIS — IMO0001 Reserved for inherently not codable concepts without codable children: Secondary | ICD-10-CM | POA: Diagnosis not present

## 2014-05-26 DIAGNOSIS — I10 Essential (primary) hypertension: Secondary | ICD-10-CM | POA: Diagnosis not present

## 2014-05-26 DIAGNOSIS — Z471 Aftercare following joint replacement surgery: Secondary | ICD-10-CM | POA: Diagnosis not present

## 2014-05-26 DIAGNOSIS — Z96659 Presence of unspecified artificial knee joint: Secondary | ICD-10-CM | POA: Diagnosis not present

## 2014-05-26 DIAGNOSIS — R269 Unspecified abnormalities of gait and mobility: Secondary | ICD-10-CM | POA: Diagnosis not present

## 2014-05-26 DIAGNOSIS — Z9181 History of falling: Secondary | ICD-10-CM | POA: Diagnosis not present

## 2014-05-26 DIAGNOSIS — IMO0001 Reserved for inherently not codable concepts without codable children: Secondary | ICD-10-CM | POA: Diagnosis not present

## 2014-05-29 DIAGNOSIS — M25569 Pain in unspecified knee: Secondary | ICD-10-CM | POA: Diagnosis not present

## 2014-06-02 DIAGNOSIS — M25569 Pain in unspecified knee: Secondary | ICD-10-CM | POA: Diagnosis not present

## 2014-06-06 DIAGNOSIS — M25569 Pain in unspecified knee: Secondary | ICD-10-CM | POA: Diagnosis not present

## 2014-06-15 DIAGNOSIS — M25569 Pain in unspecified knee: Secondary | ICD-10-CM | POA: Diagnosis not present

## 2014-06-16 DIAGNOSIS — M25569 Pain in unspecified knee: Secondary | ICD-10-CM | POA: Diagnosis not present

## 2014-06-19 DIAGNOSIS — M25569 Pain in unspecified knee: Secondary | ICD-10-CM | POA: Diagnosis not present

## 2014-06-21 DIAGNOSIS — M25569 Pain in unspecified knee: Secondary | ICD-10-CM | POA: Diagnosis not present

## 2014-06-22 DIAGNOSIS — M25569 Pain in unspecified knee: Secondary | ICD-10-CM | POA: Diagnosis not present

## 2014-06-27 DIAGNOSIS — M25569 Pain in unspecified knee: Secondary | ICD-10-CM | POA: Diagnosis not present

## 2014-06-29 DIAGNOSIS — M25569 Pain in unspecified knee: Secondary | ICD-10-CM | POA: Diagnosis not present

## 2014-07-04 DIAGNOSIS — M25569 Pain in unspecified knee: Secondary | ICD-10-CM | POA: Diagnosis not present

## 2014-07-06 DIAGNOSIS — M25569 Pain in unspecified knee: Secondary | ICD-10-CM | POA: Diagnosis not present

## 2014-07-11 DIAGNOSIS — M25569 Pain in unspecified knee: Secondary | ICD-10-CM | POA: Diagnosis not present

## 2014-07-13 DIAGNOSIS — M25569 Pain in unspecified knee: Secondary | ICD-10-CM | POA: Diagnosis not present

## 2014-07-18 DIAGNOSIS — M25569 Pain in unspecified knee: Secondary | ICD-10-CM | POA: Diagnosis not present

## 2014-07-19 DIAGNOSIS — M25569 Pain in unspecified knee: Secondary | ICD-10-CM | POA: Diagnosis not present

## 2014-07-20 DIAGNOSIS — M25569 Pain in unspecified knee: Secondary | ICD-10-CM | POA: Diagnosis not present

## 2014-08-09 DIAGNOSIS — M25569 Pain in unspecified knee: Secondary | ICD-10-CM | POA: Diagnosis not present

## 2014-08-11 DIAGNOSIS — M25569 Pain in unspecified knee: Secondary | ICD-10-CM | POA: Diagnosis not present

## 2014-08-14 DIAGNOSIS — M25569 Pain in unspecified knee: Secondary | ICD-10-CM | POA: Diagnosis not present

## 2014-08-17 DIAGNOSIS — M25569 Pain in unspecified knee: Secondary | ICD-10-CM | POA: Diagnosis not present

## 2014-08-21 DIAGNOSIS — M25569 Pain in unspecified knee: Secondary | ICD-10-CM | POA: Diagnosis not present

## 2014-08-23 DIAGNOSIS — M25569 Pain in unspecified knee: Secondary | ICD-10-CM | POA: Diagnosis not present

## 2014-08-29 DIAGNOSIS — M25569 Pain in unspecified knee: Secondary | ICD-10-CM | POA: Diagnosis not present

## 2014-09-05 DIAGNOSIS — M25569 Pain in unspecified knee: Secondary | ICD-10-CM | POA: Diagnosis not present

## 2014-09-12 DIAGNOSIS — M25569 Pain in unspecified knee: Secondary | ICD-10-CM | POA: Diagnosis not present

## 2014-09-20 DIAGNOSIS — M25569 Pain in unspecified knee: Secondary | ICD-10-CM | POA: Diagnosis not present

## 2014-09-25 DIAGNOSIS — Z01419 Encounter for gynecological examination (general) (routine) without abnormal findings: Secondary | ICD-10-CM | POA: Diagnosis not present

## 2014-09-25 DIAGNOSIS — Z124 Encounter for screening for malignant neoplasm of cervix: Secondary | ICD-10-CM | POA: Diagnosis not present

## 2014-10-25 DIAGNOSIS — E559 Vitamin D deficiency, unspecified: Secondary | ICD-10-CM | POA: Diagnosis not present

## 2014-10-25 DIAGNOSIS — K219 Gastro-esophageal reflux disease without esophagitis: Secondary | ICD-10-CM | POA: Diagnosis not present

## 2014-10-25 DIAGNOSIS — J309 Allergic rhinitis, unspecified: Secondary | ICD-10-CM | POA: Diagnosis not present

## 2014-10-25 DIAGNOSIS — M899 Disorder of bone, unspecified: Secondary | ICD-10-CM | POA: Diagnosis not present

## 2014-10-25 DIAGNOSIS — E210 Primary hyperparathyroidism: Secondary | ICD-10-CM | POA: Diagnosis not present

## 2014-10-25 DIAGNOSIS — M5127 Other intervertebral disc displacement, lumbosacral region: Secondary | ICD-10-CM | POA: Diagnosis not present

## 2014-10-25 DIAGNOSIS — Z23 Encounter for immunization: Secondary | ICD-10-CM | POA: Diagnosis not present

## 2014-10-25 DIAGNOSIS — I10 Essential (primary) hypertension: Secondary | ICD-10-CM | POA: Diagnosis not present

## 2014-11-10 DIAGNOSIS — Z96659 Presence of unspecified artificial knee joint: Secondary | ICD-10-CM | POA: Diagnosis not present

## 2015-03-20 DIAGNOSIS — H43813 Vitreous degeneration, bilateral: Secondary | ICD-10-CM | POA: Diagnosis not present

## 2015-03-20 DIAGNOSIS — D2311 Other benign neoplasm of skin of right eyelid, including canthus: Secondary | ICD-10-CM | POA: Diagnosis not present

## 2015-03-20 DIAGNOSIS — H2513 Age-related nuclear cataract, bilateral: Secondary | ICD-10-CM | POA: Diagnosis not present

## 2015-03-20 DIAGNOSIS — H5203 Hypermetropia, bilateral: Secondary | ICD-10-CM | POA: Diagnosis not present

## 2015-03-26 DIAGNOSIS — L309 Dermatitis, unspecified: Secondary | ICD-10-CM | POA: Diagnosis not present

## 2015-05-07 DIAGNOSIS — Z09 Encounter for follow-up examination after completed treatment for conditions other than malignant neoplasm: Secondary | ICD-10-CM | POA: Diagnosis not present

## 2015-05-07 DIAGNOSIS — M1712 Unilateral primary osteoarthritis, left knee: Secondary | ICD-10-CM | POA: Diagnosis not present

## 2015-05-07 DIAGNOSIS — Z96651 Presence of right artificial knee joint: Secondary | ICD-10-CM | POA: Diagnosis not present

## 2015-06-19 DIAGNOSIS — L989 Disorder of the skin and subcutaneous tissue, unspecified: Secondary | ICD-10-CM | POA: Diagnosis not present

## 2015-06-19 DIAGNOSIS — K219 Gastro-esophageal reflux disease without esophagitis: Secondary | ICD-10-CM | POA: Diagnosis not present

## 2015-06-19 DIAGNOSIS — I10 Essential (primary) hypertension: Secondary | ICD-10-CM | POA: Diagnosis not present

## 2015-06-19 DIAGNOSIS — Z1389 Encounter for screening for other disorder: Secondary | ICD-10-CM | POA: Diagnosis not present

## 2015-06-27 DIAGNOSIS — L821 Other seborrheic keratosis: Secondary | ICD-10-CM | POA: Diagnosis not present

## 2015-06-27 DIAGNOSIS — D1801 Hemangioma of skin and subcutaneous tissue: Secondary | ICD-10-CM | POA: Diagnosis not present

## 2015-06-27 DIAGNOSIS — C44319 Basal cell carcinoma of skin of other parts of face: Secondary | ICD-10-CM | POA: Diagnosis not present

## 2015-06-27 DIAGNOSIS — L309 Dermatitis, unspecified: Secondary | ICD-10-CM | POA: Diagnosis not present

## 2015-06-27 DIAGNOSIS — C44310 Basal cell carcinoma of skin of unspecified parts of face: Secondary | ICD-10-CM | POA: Diagnosis not present

## 2015-06-27 DIAGNOSIS — D0339 Melanoma in situ of other parts of face: Secondary | ICD-10-CM | POA: Diagnosis not present

## 2015-06-27 DIAGNOSIS — D225 Melanocytic nevi of trunk: Secondary | ICD-10-CM | POA: Diagnosis not present

## 2015-07-09 DIAGNOSIS — L905 Scar conditions and fibrosis of skin: Secondary | ICD-10-CM | POA: Diagnosis not present

## 2015-07-09 DIAGNOSIS — D0339 Melanoma in situ of other parts of face: Secondary | ICD-10-CM | POA: Diagnosis not present

## 2015-10-10 DIAGNOSIS — D485 Neoplasm of uncertain behavior of skin: Secondary | ICD-10-CM | POA: Diagnosis not present

## 2015-10-10 DIAGNOSIS — L821 Other seborrheic keratosis: Secondary | ICD-10-CM | POA: Diagnosis not present

## 2015-10-10 DIAGNOSIS — Z08 Encounter for follow-up examination after completed treatment for malignant neoplasm: Secondary | ICD-10-CM | POA: Diagnosis not present

## 2015-10-10 DIAGNOSIS — D225 Melanocytic nevi of trunk: Secondary | ICD-10-CM | POA: Diagnosis not present

## 2015-10-10 DIAGNOSIS — D1801 Hemangioma of skin and subcutaneous tissue: Secondary | ICD-10-CM | POA: Diagnosis not present

## 2015-10-10 DIAGNOSIS — Z85820 Personal history of malignant melanoma of skin: Secondary | ICD-10-CM | POA: Diagnosis not present

## 2015-10-10 DIAGNOSIS — B351 Tinea unguium: Secondary | ICD-10-CM | POA: Diagnosis not present

## 2015-10-27 ENCOUNTER — Emergency Department (HOSPITAL_COMMUNITY): Payer: Medicare Other

## 2015-10-27 ENCOUNTER — Encounter (HOSPITAL_COMMUNITY): Payer: Self-pay | Admitting: Emergency Medicine

## 2015-10-27 ENCOUNTER — Emergency Department (HOSPITAL_COMMUNITY)
Admission: EM | Admit: 2015-10-27 | Discharge: 2015-10-27 | Disposition: A | Discharge status: Home / Self Care | Payer: Medicare Other | Attending: Emergency Medicine | Admitting: Emergency Medicine

## 2015-10-27 DIAGNOSIS — Y998 Other external cause status: Secondary | ICD-10-CM | POA: Insufficient documentation

## 2015-10-27 DIAGNOSIS — K219 Gastro-esophageal reflux disease without esophagitis: Secondary | ICD-10-CM | POA: Diagnosis not present

## 2015-10-27 DIAGNOSIS — Y92009 Unspecified place in unspecified non-institutional (private) residence as the place of occurrence of the external cause: Secondary | ICD-10-CM | POA: Insufficient documentation

## 2015-10-27 DIAGNOSIS — W010XXA Fall on same level from slipping, tripping and stumbling without subsequent striking against object, initial encounter: Secondary | ICD-10-CM | POA: Insufficient documentation

## 2015-10-27 DIAGNOSIS — Z79899 Other long term (current) drug therapy: Secondary | ICD-10-CM | POA: Insufficient documentation

## 2015-10-27 DIAGNOSIS — S3992XA Unspecified injury of lower back, initial encounter: Secondary | ICD-10-CM | POA: Insufficient documentation

## 2015-10-27 DIAGNOSIS — M791 Myalgia: Secondary | ICD-10-CM | POA: Diagnosis not present

## 2015-10-27 DIAGNOSIS — S5292XA Unspecified fracture of left forearm, initial encounter for closed fracture: Secondary | ICD-10-CM

## 2015-10-27 DIAGNOSIS — S6992XA Unspecified injury of left wrist, hand and finger(s), initial encounter: Secondary | ICD-10-CM | POA: Diagnosis present

## 2015-10-27 DIAGNOSIS — I10 Essential (primary) hypertension: Secondary | ICD-10-CM | POA: Diagnosis not present

## 2015-10-27 DIAGNOSIS — Y9389 Activity, other specified: Secondary | ICD-10-CM | POA: Diagnosis not present

## 2015-10-27 DIAGNOSIS — S52502A Unspecified fracture of the lower end of left radius, initial encounter for closed fracture: Secondary | ICD-10-CM | POA: Diagnosis not present

## 2015-10-27 DIAGNOSIS — Z7982 Long term (current) use of aspirin: Secondary | ICD-10-CM | POA: Insufficient documentation

## 2015-10-27 DIAGNOSIS — S52592A Other fractures of lower end of left radius, initial encounter for closed fracture: Secondary | ICD-10-CM | POA: Insufficient documentation

## 2015-10-27 NOTE — ED Provider Notes (Signed)
CSN: [id number]   Arrival date & time 10/27/15 1849  History  By signing my name below, I, Altamease Oiler, attest that this documentation has been prepared under the direction and in the presence of Junius Creamer, NP Electronically Signed: Altamease Oiler, ED Scribe. 10/27/2015. 8:10 PM. Chief Complaint  Patient presents with  . Wrist Injury    HPI The history is provided by the patient. No language interpreter was used.   Marissa Gallegos is a 74 y.o. female who presents to the Emergency Department complaining of new pain and swelling at the left wrist secondary to a mechanical fall at home this afternoon. The pt was decorating and missed a step outside. She braced herself with the left hand and sat on her buttocks. She rates the pain 2/10 in severity and describes it as aching/throbbing. Associated symptoms include pain at the left buttock,swelling medial L wrist  Pt denies head injury or LOC.   Past Medical History  Diagnosis Date  . GERD (gastroesophageal reflux disease)   . Complication of anesthesia     poss low BP with anesthesia,vagal  . Hypertension     dr r. Kenton Kingfisher  . Vertigo     Past Surgical History  Procedure Laterality Date  . No past surgeries    . Total knee arthroplasty Right 04/25/2014    Procedure: TOTAL KNEE ARTHROPLASTY;  Surgeon: Hessie Dibble, MD;  Location: [facility];  Service: Orthopedics;  Laterality: Right;    History reviewed. No pertinent family history.  Social History  Substance Use Topics  . Smoking status: Never Smoker   . Smokeless tobacco: None  . Alcohol Use: Yes     Comment: occ     Review of Systems  Constitutional: Negative for fever.  Respiratory: Negative for cough and shortness of breath.   Musculoskeletal: Positive for joint swelling.       Pain and swelling at the left wrist Pain in the left buttock  Neurological: Negative for weakness and numbness.  All other systems reviewed and are negative.   Home Medications   Prior to  Admission medications   Medication Sig Start Date End Date Taking? Authorizing Provider  amLODipine (NORVASC) 10 MG tablet Take 10 mg by mouth daily.    Historical Provider, MD  aspirin EC 325 MG EC tablet Take 1 tablet (325 mg total) by mouth 2 (two) times daily after a meal. 04/27/14   Loni Dolly, PA-C  Calcium Carbonate-Vit D-Min (CALTRATE 600+D PLUS PO) Take 1 tablet by mouth 2 (two) times daily.    Historical Provider, MD  Cholecalciferol (VITAMIN D-3) 1000 UNITS CAPS Take 1,000-2,000 Units by mouth 2 (two) times daily. 2000 units in the morning and 1000 units in the evening    Historical Provider, MD  cloNIDine (CATAPRES) 0.1 MG tablet Take 0.1 mg by mouth 2 (two) times daily.    Historical Provider, MD  esomeprazole (NEXIUM) 40 MG capsule Take 40 mg by mouth 2 (two) times daily before a meal.    Historical Provider, MD  hydrochlorothiazide (HYDRODIURIL) 25 MG tablet Take 25 mg by mouth daily.    Historical Provider, MD  HYDROcodone-acetaminophen (NORCO/VICODIN) 5-325 MG per tablet Take 1-2 tablets by mouth every 6 (six) hours as needed for moderate pain (breakthrough pain). 04/27/14   Loni Dolly, PA-C  losartan (COZAAR) 100 MG tablet Take 100 mg by mouth daily.    Historical Provider, MD  Magnesium 250 MG TABS Take 250 mg by mouth 2 (two) times daily.  Historical Provider, MD  methocarbamol (ROBAXIN) 500 MG tablet Take 1 tablet (500 mg total) by mouth every 6 (six) hours as needed for muscle spasms. 04/27/14   Loni Dolly, PA-C  Multiple Vitamins-Minerals (CENTRUM PO) Take 1 tablet by mouth daily.    Historical Provider, MD  norethindrone-ethinyl estradiol (JINTELI) 1-5 MG-MCG TABS Take 1 tablet by mouth daily.    Historical Provider, MD    Allergies  Review of patient's allergies indicates no known allergies.  Triage Vitals: BP 158/71 mmHg  Pulse 80  Temp(Src) 98.1 F (36.7 C) (Oral)  Resp 20  SpO2 100%  Physical Exam  Constitutional: She appears well-developed and  well-nourished.  HENT:  Head: Normocephalic.  Eyes: Pupils are equal, round, and reactive to light.  Neck: Normal range of motion.  Cardiovascular: Normal rate.   Pulmonary/Chest: Effort normal.  Abdominal: Soft.  Musculoskeletal: Normal range of motion. She exhibits tenderness.       Left wrist: She exhibits swelling and deformity. She exhibits normal range of motion, no tenderness, no bony tenderness, no effusion, no crepitus and no laceration.  Neurological: She is alert.  Skin: Skin is warm.  Nursing note and vitals reviewed.   ED Course  Procedures  DIAGNOSTIC STUDIES: Oxygen Saturation is 100% on RA,  normal by my interpretation.    COORDINATION OF CARE: 8:03 PM Discussed treatment plan which includes XRs of the left wrist and hand with pt at bedside and pt agreed to the plan.  8:10 PM I re-evaluated the patient and provided an update on the results of her XRs.    Labs Review- Labs Reviewed - No data to display  Imaging Review Dg Wrist Complete Left  10/27/2015  CLINICAL DATA:  Pain following fall EXAM: LEFT WRIST - COMPLETE 3+ VIEW COMPARISON:  None. FINDINGS: Frontal, oblique, lateral, and ulnar deviation scaphoid images were obtained. There is a nondisplaced obliquely oriented fracture of the distal radial metaphysis. No other fracture. No dislocation. There is osteoarthritic change in the scaphotrapezial and first carpal -metacarpal joints. No erosive change. IMPRESSION: Nondisplaced obliquely oriented fracture distal radial metaphysis. No other fractures. No dislocation. Areas of osteoarthritic change in the lateral wrist region. Electronically Signed   By: Lowella Grip III M.D.   On: 10/27/2015 19:57   Dg Hand Complete Left  10/27/2015  CLINICAL DATA:  Mechanical fall, swelling at left wrist. EXAM: LEFT HAND - COMPLETE 3+ VIEW COMPARISON:  None. FINDINGS: Slightly displaced fracture noted at the distal left radius with loss of the normal volar relationship at the  radiocarpal joint space. Carpal bones appear intact and well aligned. Phalanges appear intact and well aligned. There is at least mild diffuse osteopenia. 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 IMPRESSION: 1. Slightly displaced fracture of the distal left radius. Associated loss of the normal volar relationship at the radiocarpal joint space. No frank dislocation at the radiocarpal joint space. 2. No other fracture seen. No fracture within the osseous structures of the left hand. 3. Degenerative changes as above. Electronically Signed   By: Franki Cabot M.D.   On: 10/27/2015 19:57   Will be placed in splint and follow up with her Orthopedist  MDM   Final diagnoses:  Radius fracture, left, closed, initial encounter    I personally performed the services described in this documentation, which was scribed in my presence. The recorded information has been reviewed and is accurate.     Junius Creamer, NP 10/27/15 2051  Tanna Furry, MD  11/14/15 1635 

## 2015-10-27 NOTE — ED Notes (Signed)
Pt reports having a mechanical fall and tripping over something. Landed on left wrist. Some deformity noted to left wrist. Also having left buttock pain. No other c/c. A&Ox4. Not on blood thinners. Did not hit head or lose consciousness.

## 2015-10-27 NOTE — Discharge Instructions (Signed)
Cast or Splint Care Casts and splints support injured limbs and keep bones from moving while they heal.  HOME CARE  Keep the cast or splint uncovered during the drying period.  A plaster cast can take 24 to 48 hours to dry.  A fiberglass cast will dry in less than 1 hour.  Do not rest the cast on anything harder than a pillow for 24 hours.  Do not put weight on your injured limb. Do not put pressure on the cast. Wait for your doctor's approval.  Keep the cast or splint dry.  Cover the cast or splint with a plastic bag during baths or wet weather.  If you have a cast over your chest and belly (trunk), take sponge baths until the cast is taken off.  If your cast gets wet, dry it with a towel or blow dryer. Use the cool setting on the blow dryer.  Keep your cast or splint clean. Wash a dirty cast with a damp cloth.  Do not put any objects under your cast or splint.  Do not scratch the skin under the cast with an object. If itching is a problem, use a blow dryer on a cool setting over the itchy area.  Do not trim or cut your cast.  Do not take out the padding from inside your cast.  Exercise your joints near the cast as told by your doctor.  Raise (elevate) your injured limb on 1 or 2 pillows for the first 1 to 3 days. GET HELP IF:  Your cast or splint cracks.  Your cast or splint is too tight or too loose.  You itch badly under the cast.  Your cast gets wet or has a soft spot.  You have a bad smell coming from the cast.  You get an object stuck under the cast.  Your skin around the cast becomes red or sore.  You have new or more pain after the cast is put on. GET HELP RIGHT AWAY IF:  You have fluid leaking through the cast.  You cannot move your fingers or toes.  Your fingers or toes turn blue or white or are cool, painful, or puffy (swollen).  You have tingling or lose feeling (numbness) around the injured area.  You have bad pain or pressure under the  cast.  You have trouble breathing or have shortness of breath.  You have chest pain.   This information is not intended to replace advice given to you by your health care provider. Make sure you discuss any questions you have with your health care provider.   Document Released: 04/16/2011 Document Revised: 08/17/2013 Document Reviewed: 06/23/2013 Elsevier Interactive Patient Education 2016 [employer]  Forearm Fracture A forearm fracture is a break in one or both of the bones of your arm that are between the elbow and the wrist. Your forearm is made up of two bones:  Radius. This is the bone on the inside of your arm near your thumb.  Ulna. This is the bone on the outside of your arm near your little finger. Middle forearm fractures usually break both the radius and the ulna. Most forearm fractures that involve both the ulna and radius will require surgery. CAUSES Common causes of this type of fracture include:  Falling on an outstretched arm.  Accidents, such as a car or bike accident.  A hard, direct hit to the middle part of your arm. RISK FACTORS You may be at higher risk for this type  of fracture if:  You play contact sports.  You have a condition that causes your bones to be weak or thin (osteoporosis). SIGNS AND SYMPTOMS A forearm fracture causes pain immediately after the injury. Other signs and symptoms include:  An abnormal bend or bump in your arm (deformity).  Swelling.  Numbness or tingling.  Tenderness.  Inability to turn your hand from side to side (rotate).  Bruising. DIAGNOSIS Your health care provider may diagnose a forearm fracture based on:  Your symptoms.  Your medical history, including any recent injury.  A physical exam. Your health care provider will look for any deformity and feel for tenderness over the break. Your health care provider will also check whether the bones are out of place.  An X-ray exam to confirm the diagnosis and  learn more about the type of fracture. TREATMENT The goals of treatment are to get the bone or bones in proper position for healing and to keep the bones from moving so they will heal over time. Your treatment will depend on many factors, especially the type of fracture that you have.  If the fractured bone or bones:  Are in the correct position (nondisplaced), you may only need to wear a cast or a splint.  Have a slightly displaced fracture, you may need to have the bones moved back into place manually (closed reduction) before the splint or cast is put on.  You may have a temporary splint before you have a cast. The splint allows room for some swelling. After a few days, a cast can replace the splint.  You may have to wear the cast for 6-8 weeks or as directed by your health care provider.  The cast may be changed after about 3 weeks or as directed by your health care provider.  After your cast is removed, you may need physical therapy to regain full movement in your wrist or elbow.  You may need emergency surgery if you have:  A fractured bone or bones that are out of position (displaced).  A fracture with multiple fragments (comminuted fracture).  A fracture that breaks the skin (open fracture). This type of fracture may require surgical wires, plates, or screws to hold the bone or bones in place.  You may have X-rays every couple of weeks to check on your healing. HOME CARE INSTRUCTIONS If You Have a Cast:  Do not stick anything inside the cast to scratch your skin. Doing that increases your risk of infection.  Check the skin around the cast every day. Report any concerns to your health care provider. You may put lotion on dry skin around the edges of the cast. Do not apply lotion to the skin underneath the cast. If You Have a Splint:  Wear it as directed by your health care provider. Remove it only as directed by your health care provider.  Loosen the splint if your fingers  become numb and tingle, or if they turn cold and blue. Bathing  Cover the cast or splint with a watertight plastic bag to protect it from water while you bathe or shower. Do not let the cast or splint get wet. Managing Pain, Stiffness, and Swelling  If directed, apply ice to the injured area:  Put ice in a plastic bag.  Place a towel between your skin and the bag.  Leave the ice on for 20 minutes, 2-3 times a day.  Move your fingers often to avoid stiffness and to lessen swelling.  Raise the  injured area above the level of your heart while you are sitting or lying down. Driving  Do not drive or operate heavy machinery while taking pain medicine.  Do not drive while wearing a cast or splint on a hand that you use for driving. Activity  Return to your normal activities as directed by your health care provider. Ask your health care provider what activities are safe for you.  Perform range-of-motion exercises only as directed by your health care provider. Safety  Do not use your injured limb to support your body weight until your health care provider says that you can. General Instructions  Do not put pressure on any part of the cast or splint until it is fully hardened. This may take several hours.  Keep the cast or splint clean and dry.  Do not use any tobacco products, including cigarettes, chewing tobacco, or electronic cigarettes. Tobacco can delay bone healing. If you need help quitting, ask your health care provider.  Take medicines only as directed by your health care provider.  Keep all follow-up visits as directed by your health care provider. This is important. SEEK MEDICAL CARE IF:  Your pain medicine is not helping.  Your cast or splint becomes wet or damaged or suddenly feels too tight.  Your cast becomes loose.  You have more severe pain or swelling than you did before the cast.  You have severe pain when you stretch your fingers.  You continue to have  pain or stiffness in your elbow or your wrist after your cast is removed. SEEK IMMEDIATE MEDICAL CARE IF:  You cannot move your fingers.  You lose feeling in your fingers or your hand.  Your hand or your fingers turn cold and pale or blue.  You notice a bad smell coming from your cast.  You have drainage from underneath your cast.  You have new stains from blood or drainage that is coming through your cast.   This information is not intended to replace advice given to you by your health care provider. Make sure you discuss any questions you have with your health care provider.   Document Released: 12/12/2000 Document Revised: 01/05/2015 Document Reviewed: 07/31/2014 Elsevier Interactive Patient Education 2016 [employer]. Make an appointment with your Orthopedist for cat placement early next week

## 2015-10-29 DIAGNOSIS — S52532A Colles' fracture of left radius, initial encounter for closed fracture: Secondary | ICD-10-CM | POA: Diagnosis not present

## 2015-11-12 DIAGNOSIS — S52532D Colles' fracture of left radius, subsequent encounter for closed fracture with routine healing: Secondary | ICD-10-CM | POA: Diagnosis not present

## 2015-12-03 DIAGNOSIS — S52532D Colles' fracture of left radius, subsequent encounter for closed fracture with routine healing: Secondary | ICD-10-CM | POA: Diagnosis not present

## 2015-12-05 DIAGNOSIS — S52532D Colles' fracture of left radius, subsequent encounter for closed fracture with routine healing: Secondary | ICD-10-CM | POA: Diagnosis not present

## 2016-01-09 DIAGNOSIS — Z1283 Encounter for screening for malignant neoplasm of skin: Secondary | ICD-10-CM | POA: Diagnosis not present

## 2016-01-09 DIAGNOSIS — Z08 Encounter for follow-up examination after completed treatment for malignant neoplasm: Secondary | ICD-10-CM | POA: Diagnosis not present

## 2016-01-09 DIAGNOSIS — Z85820 Personal history of malignant melanoma of skin: Secondary | ICD-10-CM | POA: Diagnosis not present

## 2016-01-10 DIAGNOSIS — Z1231 Encounter for screening mammogram for malignant neoplasm of breast: Secondary | ICD-10-CM | POA: Diagnosis not present

## 2016-01-10 DIAGNOSIS — Z79890 Hormone replacement therapy (postmenopausal): Secondary | ICD-10-CM | POA: Diagnosis not present

## 2016-01-10 DIAGNOSIS — Z124 Encounter for screening for malignant neoplasm of cervix: Secondary | ICD-10-CM | POA: Diagnosis not present

## 2016-01-23 DIAGNOSIS — M810 Age-related osteoporosis without current pathological fracture: Secondary | ICD-10-CM | POA: Diagnosis not present

## 2016-01-23 DIAGNOSIS — M8588 Other specified disorders of bone density and structure, other site: Secondary | ICD-10-CM | POA: Diagnosis not present

## 2016-02-25 DIAGNOSIS — Z23 Encounter for immunization: Secondary | ICD-10-CM | POA: Diagnosis not present

## 2016-03-03 DIAGNOSIS — J301 Allergic rhinitis due to pollen: Secondary | ICD-10-CM | POA: Diagnosis not present

## 2016-03-03 DIAGNOSIS — I10 Essential (primary) hypertension: Secondary | ICD-10-CM | POA: Diagnosis not present

## 2016-03-03 DIAGNOSIS — M170 Bilateral primary osteoarthritis of knee: Secondary | ICD-10-CM | POA: Diagnosis not present

## 2016-03-03 DIAGNOSIS — M810 Age-related osteoporosis without current pathological fracture: Secondary | ICD-10-CM | POA: Diagnosis not present

## 2016-03-20 DIAGNOSIS — F4323 Adjustment disorder with mixed anxiety and depressed mood: Secondary | ICD-10-CM | POA: Diagnosis not present

## 2016-03-24 DIAGNOSIS — H25013 Cortical age-related cataract, bilateral: Secondary | ICD-10-CM | POA: Diagnosis not present

## 2016-03-24 DIAGNOSIS — H2513 Age-related nuclear cataract, bilateral: Secondary | ICD-10-CM | POA: Diagnosis not present

## 2016-03-24 DIAGNOSIS — H524 Presbyopia: Secondary | ICD-10-CM | POA: Diagnosis not present

## 2016-03-24 DIAGNOSIS — H43813 Vitreous degeneration, bilateral: Secondary | ICD-10-CM | POA: Diagnosis not present

## 2016-03-31 DIAGNOSIS — F4323 Adjustment disorder with mixed anxiety and depressed mood: Secondary | ICD-10-CM | POA: Diagnosis not present

## 2016-04-07 DIAGNOSIS — D1801 Hemangioma of skin and subcutaneous tissue: Secondary | ICD-10-CM | POA: Diagnosis not present

## 2016-04-07 DIAGNOSIS — D225 Melanocytic nevi of trunk: Secondary | ICD-10-CM | POA: Diagnosis not present

## 2016-04-07 DIAGNOSIS — D485 Neoplasm of uncertain behavior of skin: Secondary | ICD-10-CM | POA: Diagnosis not present

## 2016-04-07 DIAGNOSIS — Z08 Encounter for follow-up examination after completed treatment for malignant neoplasm: Secondary | ICD-10-CM | POA: Diagnosis not present

## 2016-04-07 DIAGNOSIS — L821 Other seborrheic keratosis: Secondary | ICD-10-CM | POA: Diagnosis not present

## 2016-04-07 DIAGNOSIS — Z85820 Personal history of malignant melanoma of skin: Secondary | ICD-10-CM | POA: Diagnosis not present

## 2016-04-22 DIAGNOSIS — F4323 Adjustment disorder with mixed anxiety and depressed mood: Secondary | ICD-10-CM | POA: Diagnosis not present

## 2016-05-01 DIAGNOSIS — F4323 Adjustment disorder with mixed anxiety and depressed mood: Secondary | ICD-10-CM | POA: Diagnosis not present

## 2016-05-08 DIAGNOSIS — F4323 Adjustment disorder with mixed anxiety and depressed mood: Secondary | ICD-10-CM | POA: Diagnosis not present

## 2016-05-15 DIAGNOSIS — F4323 Adjustment disorder with mixed anxiety and depressed mood: Secondary | ICD-10-CM | POA: Diagnosis not present

## 2016-05-20 DIAGNOSIS — N3001 Acute cystitis with hematuria: Secondary | ICD-10-CM | POA: Diagnosis not present

## 2016-05-20 DIAGNOSIS — R300 Dysuria: Secondary | ICD-10-CM | POA: Diagnosis not present

## 2016-06-11 DIAGNOSIS — F4323 Adjustment disorder with mixed anxiety and depressed mood: Secondary | ICD-10-CM | POA: Diagnosis not present

## 2016-06-19 DIAGNOSIS — F4323 Adjustment disorder with mixed anxiety and depressed mood: Secondary | ICD-10-CM | POA: Diagnosis not present

## 2016-07-15 DIAGNOSIS — I10 Essential (primary) hypertension: Secondary | ICD-10-CM | POA: Diagnosis not present

## 2016-07-15 DIAGNOSIS — M810 Age-related osteoporosis without current pathological fracture: Secondary | ICD-10-CM | POA: Diagnosis not present

## 2016-07-15 DIAGNOSIS — K219 Gastro-esophageal reflux disease without esophagitis: Secondary | ICD-10-CM | POA: Diagnosis not present

## 2016-07-15 DIAGNOSIS — Z1211 Encounter for screening for malignant neoplasm of colon: Secondary | ICD-10-CM | POA: Diagnosis not present

## 2016-10-31 DIAGNOSIS — Z23 Encounter for immunization: Secondary | ICD-10-CM | POA: Diagnosis not present

## 2017-02-16 DIAGNOSIS — Z1231 Encounter for screening mammogram for malignant neoplasm of breast: Secondary | ICD-10-CM | POA: Diagnosis not present

## 2017-02-16 DIAGNOSIS — Z124 Encounter for screening for malignant neoplasm of cervix: Secondary | ICD-10-CM | POA: Diagnosis not present

## 2017-02-16 DIAGNOSIS — Z01419 Encounter for gynecological examination (general) (routine) without abnormal findings: Secondary | ICD-10-CM | POA: Diagnosis not present

## 2017-03-02 DIAGNOSIS — K219 Gastro-esophageal reflux disease without esophagitis: Secondary | ICD-10-CM | POA: Diagnosis not present

## 2017-03-02 DIAGNOSIS — M170 Bilateral primary osteoarthritis of knee: Secondary | ICD-10-CM | POA: Diagnosis not present

## 2017-03-02 DIAGNOSIS — K439 Ventral hernia without obstruction or gangrene: Secondary | ICD-10-CM | POA: Diagnosis not present

## 2017-03-02 DIAGNOSIS — I10 Essential (primary) hypertension: Secondary | ICD-10-CM | POA: Diagnosis not present

## 2017-03-24 DIAGNOSIS — H43813 Vitreous degeneration, bilateral: Secondary | ICD-10-CM | POA: Diagnosis not present

## 2017-03-24 DIAGNOSIS — H524 Presbyopia: Secondary | ICD-10-CM | POA: Diagnosis not present

## 2017-03-24 DIAGNOSIS — H2513 Age-related nuclear cataract, bilateral: Secondary | ICD-10-CM | POA: Diagnosis not present

## 2017-03-24 DIAGNOSIS — H25013 Cortical age-related cataract, bilateral: Secondary | ICD-10-CM | POA: Diagnosis not present

## 2017-09-17 DIAGNOSIS — E559 Vitamin D deficiency, unspecified: Secondary | ICD-10-CM | POA: Diagnosis not present

## 2017-09-17 DIAGNOSIS — I10 Essential (primary) hypertension: Secondary | ICD-10-CM | POA: Diagnosis not present

## 2017-09-17 DIAGNOSIS — M810 Age-related osteoporosis without current pathological fracture: Secondary | ICD-10-CM | POA: Diagnosis not present

## 2017-09-17 DIAGNOSIS — K219 Gastro-esophageal reflux disease without esophagitis: Secondary | ICD-10-CM | POA: Diagnosis not present

## 2017-09-17 DIAGNOSIS — Z23 Encounter for immunization: Secondary | ICD-10-CM | POA: Diagnosis not present

## 2017-09-23 DIAGNOSIS — I10 Essential (primary) hypertension: Secondary | ICD-10-CM | POA: Diagnosis not present

## 2017-09-23 DIAGNOSIS — E559 Vitamin D deficiency, unspecified: Secondary | ICD-10-CM | POA: Diagnosis not present

## 2017-09-30 DIAGNOSIS — R739 Hyperglycemia, unspecified: Secondary | ICD-10-CM | POA: Diagnosis not present

## 2017-11-01 DIAGNOSIS — N390 Urinary tract infection, site not specified: Secondary | ICD-10-CM | POA: Diagnosis not present

## 2018-03-23 DIAGNOSIS — E559 Vitamin D deficiency, unspecified: Secondary | ICD-10-CM | POA: Diagnosis not present

## 2018-03-23 DIAGNOSIS — I10 Essential (primary) hypertension: Secondary | ICD-10-CM | POA: Diagnosis not present

## 2018-03-23 DIAGNOSIS — K219 Gastro-esophageal reflux disease without esophagitis: Secondary | ICD-10-CM | POA: Diagnosis not present

## 2018-03-23 DIAGNOSIS — M899 Disorder of bone, unspecified: Secondary | ICD-10-CM | POA: Diagnosis not present

## 2018-04-07 ENCOUNTER — Other Ambulatory Visit: Payer: Self-pay

## 2018-04-07 ENCOUNTER — Ambulatory Visit (HOSPITAL_COMMUNITY)
Admission: EM | Admit: 2018-04-07 | Discharge: 2018-04-07 | Disposition: A | Discharge status: Home / Self Care | Payer: Medicare Other | Attending: Family Medicine | Admitting: Family Medicine

## 2018-04-07 ENCOUNTER — Encounter (HOSPITAL_COMMUNITY): Payer: Self-pay | Admitting: Emergency Medicine

## 2018-04-07 DIAGNOSIS — I10 Essential (primary) hypertension: Secondary | ICD-10-CM | POA: Insufficient documentation

## 2018-04-07 DIAGNOSIS — Z96651 Presence of right artificial knee joint: Secondary | ICD-10-CM | POA: Diagnosis not present

## 2018-04-07 DIAGNOSIS — R9431 Abnormal electrocardiogram [ECG] [EKG]: Secondary | ICD-10-CM | POA: Diagnosis not present

## 2018-04-07 DIAGNOSIS — R449 Unspecified symptoms and signs involving general sensations and perceptions: Secondary | ICD-10-CM

## 2018-04-07 DIAGNOSIS — R002 Palpitations: Secondary | ICD-10-CM | POA: Diagnosis not present

## 2018-04-07 DIAGNOSIS — K219 Gastro-esophageal reflux disease without esophagitis: Secondary | ICD-10-CM | POA: Diagnosis not present

## 2018-04-07 DIAGNOSIS — R6889 Other general symptoms and signs: Secondary | ICD-10-CM

## 2018-04-07 LAB — CBC
HCT: 41.7 % (ref 36.0–46.0)
Hemoglobin: 14 g/dL (ref 12.0–15.0)
MCH: 30.1 pg (ref 26.0–34.0)
MCHC: 33.6 g/dL (ref 30.0–36.0)
MCV: 89.7 fL (ref 78.0–100.0)
PLATELETS: 308 10*3/uL (ref 150–400)
RBC: 4.65 MIL/uL (ref 3.87–5.11)
RDW: 13.7 % (ref 11.5–15.5)
WBC: 6.2 10*3/uL (ref 4.0–10.5)

## 2018-04-07 LAB — BASIC METABOLIC PANEL
ANION GAP: 11 (ref 5–15)
BUN: 15 mg/dL (ref 6–20)
CO2: 23 mmol/L (ref 22–32)
Calcium: 10.7 mg/dL — ABNORMAL HIGH (ref 8.9–10.3)
Chloride: 106 mmol/L (ref 101–111)
Creatinine, Ser: 0.83 mg/dL (ref 0.44–1.00)
Glucose, Bld: 108 mg/dL — ABNORMAL HIGH (ref 65–99)
POTASSIUM: 3.4 mmol/L — AB (ref 3.5–5.1)
Sodium: 140 mmol/L (ref 135–145)

## 2018-04-07 LAB — TSH: TSH: 1.915 u[IU]/mL (ref 0.350–4.500)

## 2018-04-07 NOTE — ED Triage Notes (Signed)
Pt states she was at an event today and started feeling anxious, feels like her heart is racing. Hx of HTN, no hx of anxiety or MI.

## 2018-04-07 NOTE — ED Notes (Signed)
EKG given to Dr. Hagler 

## 2018-04-07 NOTE — ED Provider Notes (Signed)
[facility]   [id number] 04/07/18 Arrival Time: 8841  ASSESSMENT & PLAN:  1. Palpitations   2. Feeling abnormal    Pending: Labs Reviewed  CBC  BASIC METABOLIC PANEL  TSH   Unclear etiology of her symptoms. No worsening or new symptoms. She is comfortable with observation at home. Agrees to proceed to the ED should symptoms return or worsen.  Chest pain precautions given. Reviewed expectations re: course of current medical issues. Questions answered. Outlined signs and symptoms indicating need for more acute intervention. Patient verbalized understanding. After Visit Summary given.   SUBJECTIVE:  Marissa Gallegos is a 77 y.o. female who presents with complaint of feeling not like herself and feeling her heart race this morning. Was at a volunteer event when she reports abrupt onset of her symptoms. "Felt like a thickness in my head." She was concerned this may be related to elevated blood pressure. The heart racing described as sporadic. No associated SOB/n/v/diaphoresis/CP. Has felt very anxious since symptoms began. Eating and drinking normally. No specific aggravating or alleviating factors reported. Taking her medications as directed. No recent illnesses. No HA, extremity weakness or sensation changes described. Husband with her and reports she has been acting her normal self. OTC treatment: none.  ROS: As per HPI. All other systems negative.   OBJECTIVE:  Vitals:   04/07/18 1304 04/07/18 1309  BP:  127/70  Pulse: 91   Resp: 18   Temp: (!) 97.1 F (36.2 C)   SpO2: 99%     General appearance: alert; no distress Eyes: PERRLA; EOMI; conjunctiva normal HENT: normocephalic; atraumatic Neck: supple Lungs: clear to auscultation bilaterally Heart: regular rate and rhythm Extremities: no edema; symmetrical with no gross deformities Skin: warm and dry Neuro: CN 2-12 intact; extremities with normal strength and sensation Psychological: alert and cooperative;  normal mood and affect  ECG: Orders placed or performed during the hospital encounter of 04/07/18  . ED EKG  . ED EKG   No acute changes noted by me today.  No Known Allergies  Past Medical History:  Diagnosis Date  . Complication of anesthesia    poss low BP with anesthesia,vagal  . GERD (gastroesophageal reflux disease)   . Hypertension    dr Alfonso Patten. Kenton Kingfisher  . Vertigo    Social History   Socioeconomic History  . Marital status: Married    Spouse name: Not on file  . Number of children: Not on file  . Years of education: Not on file  . Highest education level: Not on file  Occupational History  . Not on file  Social Needs  . Financial resource strain: Not on file  . Food insecurity:    Worry: Not on file    Inability: Not on file  . Transportation needs:    Medical: Not on file    Non-medical: Not on file  Tobacco Use  . Smoking status: Never Smoker  Substance and Sexual Activity  . Alcohol use: Yes    Comment: occ  . Drug use: Not on file  . Sexual activity: Not on file  Lifestyle  . Physical activity:    Days per week: Not on file    Minutes per session: Not on file  . Stress: Not on file  Relationships  . Social connections:    Talks on phone: Not on file    Gets together: Not on file    Attends religious service: Not on file    Active member of club or organization: Not  on file    Attends meetings of clubs or organizations: Not on file    Relationship status: Not on file  . Intimate partner violence:    Fear of current or ex partner: Not on file    Emotionally abused: Not on file    Physically abused: Not on file    Forced sexual activity: Not on file  Other Topics Concern  . Not on file  Social History Narrative  . Not on file   FH: "Heart problems."   Past Surgical History:  Procedure Laterality Date  . NO PAST SURGERIES    . 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

## 2018-04-12 ENCOUNTER — Telehealth (HOSPITAL_COMMUNITY): Payer: Self-pay

## 2018-04-12 NOTE — Telephone Encounter (Signed)
Pt called and aware of results via mychart. No further questions.

## 2018-09-13 DIAGNOSIS — J301 Allergic rhinitis due to pollen: Secondary | ICD-10-CM | POA: Diagnosis not present

## 2018-09-13 DIAGNOSIS — M170 Bilateral primary osteoarthritis of knee: Secondary | ICD-10-CM | POA: Diagnosis not present

## 2018-09-13 DIAGNOSIS — Z0000 Encounter for general adult medical examination without abnormal findings: Secondary | ICD-10-CM | POA: Diagnosis not present

## 2018-09-13 DIAGNOSIS — M810 Age-related osteoporosis without current pathological fracture: Secondary | ICD-10-CM | POA: Diagnosis not present

## 2018-09-13 DIAGNOSIS — Z23 Encounter for immunization: Secondary | ICD-10-CM | POA: Diagnosis not present

## 2018-09-13 DIAGNOSIS — I10 Essential (primary) hypertension: Secondary | ICD-10-CM | POA: Diagnosis not present

## 2018-09-13 DIAGNOSIS — K219 Gastro-esophageal reflux disease without esophagitis: Secondary | ICD-10-CM | POA: Diagnosis not present

## 2018-09-13 DIAGNOSIS — Z1211 Encounter for screening for malignant neoplasm of colon: Secondary | ICD-10-CM | POA: Diagnosis not present

## 2018-09-16 DIAGNOSIS — Z1211 Encounter for screening for malignant neoplasm of colon: Secondary | ICD-10-CM | POA: Diagnosis not present

## 2019-04-20 DIAGNOSIS — M810 Age-related osteoporosis without current pathological fracture: Secondary | ICD-10-CM | POA: Diagnosis not present

## 2019-04-20 DIAGNOSIS — N390 Urinary tract infection, site not specified: Secondary | ICD-10-CM | POA: Diagnosis not present

## 2019-04-20 DIAGNOSIS — I10 Essential (primary) hypertension: Secondary | ICD-10-CM | POA: Diagnosis not present

## 2019-04-20 DIAGNOSIS — K219 Gastro-esophageal reflux disease without esophagitis: Secondary | ICD-10-CM | POA: Diagnosis not present

## 2019-05-13 DIAGNOSIS — I10 Essential (primary) hypertension: Secondary | ICD-10-CM | POA: Diagnosis not present

## 2019-05-13 DIAGNOSIS — E210 Primary hyperparathyroidism: Secondary | ICD-10-CM | POA: Diagnosis not present

## 2019-06-16 DIAGNOSIS — Z79890 Hormone replacement therapy (postmenopausal): Secondary | ICD-10-CM | POA: Diagnosis not present

## 2019-06-16 DIAGNOSIS — Z01419 Encounter for gynecological examination (general) (routine) without abnormal findings: Secondary | ICD-10-CM | POA: Diagnosis not present

## 2019-06-16 DIAGNOSIS — Z124 Encounter for screening for malignant neoplasm of cervix: Secondary | ICD-10-CM | POA: Diagnosis not present

## 2019-06-22 DIAGNOSIS — Z1231 Encounter for screening mammogram for malignant neoplasm of breast: Secondary | ICD-10-CM | POA: Diagnosis not present

## 2019-09-14 DIAGNOSIS — I10 Essential (primary) hypertension: Secondary | ICD-10-CM | POA: Diagnosis not present

## 2019-09-14 DIAGNOSIS — M810 Age-related osteoporosis without current pathological fracture: Secondary | ICD-10-CM | POA: Diagnosis not present

## 2019-09-14 DIAGNOSIS — M170 Bilateral primary osteoarthritis of knee: Secondary | ICD-10-CM | POA: Diagnosis not present

## 2019-09-16 DIAGNOSIS — M170 Bilateral primary osteoarthritis of knee: Secondary | ICD-10-CM | POA: Diagnosis not present

## 2019-09-16 DIAGNOSIS — M810 Age-related osteoporosis without current pathological fracture: Secondary | ICD-10-CM | POA: Diagnosis not present

## 2019-09-16 DIAGNOSIS — K219 Gastro-esophageal reflux disease without esophagitis: Secondary | ICD-10-CM | POA: Diagnosis not present

## 2019-09-16 DIAGNOSIS — I10 Essential (primary) hypertension: Secondary | ICD-10-CM | POA: Diagnosis not present

## 2019-09-16 DIAGNOSIS — Z0000 Encounter for general adult medical examination without abnormal findings: Secondary | ICD-10-CM | POA: Diagnosis not present

## 2019-09-16 DIAGNOSIS — E559 Vitamin D deficiency, unspecified: Secondary | ICD-10-CM | POA: Diagnosis not present

## 2019-09-22 DIAGNOSIS — E559 Vitamin D deficiency, unspecified: Secondary | ICD-10-CM | POA: Diagnosis not present

## 2019-09-22 DIAGNOSIS — I10 Essential (primary) hypertension: Secondary | ICD-10-CM | POA: Diagnosis not present

## 2019-09-25 DIAGNOSIS — Z23 Encounter for immunization: Secondary | ICD-10-CM | POA: Diagnosis not present

## 2019-10-21 DIAGNOSIS — Z23 Encounter for immunization: Secondary | ICD-10-CM | POA: Diagnosis not present

## 2020-01-18 ENCOUNTER — Ambulatory Visit: Payer: Medicare Other | Attending: Internal Medicine

## 2020-01-18 DIAGNOSIS — Z23 Encounter for immunization: Secondary | ICD-10-CM | POA: Diagnosis not present

## 2020-01-18 NOTE — Progress Notes (Signed)
   Covid-19 Vaccination Clinic  Name:  Shenique Harju    MRN: [id number] DOB: 10-29-1941  01/18/2020  Ms. Belinsky was observed post Covid-19 immunization for 15 minutes without incidence. She was provided with Vaccine Information Sheet and instruction to access the V-Safe system.   Ms. Gura was instructed to call 911 with any severe reactions post vaccine: Marland Kitchen Difficulty breathing  . Swelling of your face and throat  . A fast heartbeat  . A bad rash all over your body  . Dizziness and weakness    Immunizations Administered    Name Date Dose VIS Date Route   Pfizer COVID-19 Vaccine 01/18/2020  2:02 PM 0.3 mL 12/09/2019 Intramuscular   Manufacturer: [place]   Lot: [id number]   [place]: [id number]

## 2020-02-06 ENCOUNTER — Ambulatory Visit: Payer: Medicare Other | Attending: Internal Medicine

## 2020-02-06 DIAGNOSIS — Z23 Encounter for immunization: Secondary | ICD-10-CM | POA: Insufficient documentation

## 2020-02-06 NOTE — Progress Notes (Signed)
   Covid-19 Vaccination Clinic  Name:  Marissa Gallegos    MRN: [id number] DOB: 1941-01-09  02/06/2020  Marissa Gallegos was observed post Covid-19 immunization for 15 minutes without incidence. She was provided with Vaccine Information Sheet and instruction to access the V-Safe system.   Marissa Gallegos was instructed to call 911 with any severe reactions post vaccine: Marland Kitchen Difficulty breathing  . Swelling of your face and throat  . A fast heartbeat  . A bad rash all over your body  . Dizziness and weakness    Immunizations Administered    Name Date Dose VIS Date Route   Pfizer COVID-19 Vaccine 02/06/2020  8:56 AM 0.3 mL 12/09/2019 Intramuscular   Manufacturer: [place]   Lot: [id number]   [place]: [id number]

## 2020-03-14 DIAGNOSIS — E559 Vitamin D deficiency, unspecified: Secondary | ICD-10-CM | POA: Diagnosis not present

## 2020-03-14 DIAGNOSIS — R0789 Other chest pain: Secondary | ICD-10-CM | POA: Diagnosis not present

## 2020-03-14 DIAGNOSIS — I10 Essential (primary) hypertension: Secondary | ICD-10-CM | POA: Diagnosis not present

## 2020-03-14 DIAGNOSIS — K219 Gastro-esophageal reflux disease without esophagitis: Secondary | ICD-10-CM | POA: Diagnosis not present

## 2020-03-14 DIAGNOSIS — R739 Hyperglycemia, unspecified: Secondary | ICD-10-CM | POA: Diagnosis not present

## 2020-03-14 DIAGNOSIS — M810 Age-related osteoporosis without current pathological fracture: Secondary | ICD-10-CM | POA: Diagnosis not present

## 2020-03-14 DIAGNOSIS — M170 Bilateral primary osteoarthritis of knee: Secondary | ICD-10-CM | POA: Diagnosis not present

## 2020-03-15 ENCOUNTER — Other Ambulatory Visit: Payer: Self-pay | Admitting: Family Medicine

## 2020-03-15 ENCOUNTER — Ambulatory Visit
Admission: RE | Admit: 2020-03-15 | Discharge: 2020-03-15 | Disposition: A | Discharge status: Home / Self Care | Payer: Medicare Other | Source: Ambulatory Visit | Attending: Family Medicine | Admitting: Family Medicine

## 2020-03-15 DIAGNOSIS — R0789 Other chest pain: Secondary | ICD-10-CM

## 2020-04-11 IMAGING — CR DG RIBS W/ CHEST 3+V*L*
3 series · 3 of 3 positions shown · non-contrast
Comparison: April 17, 2014.

CLINICAL DATA: Left chest wall pain without known injury.

EXAM:
LEFT RIBS AND CHEST - 3+ VIEW

[w chest pa *]
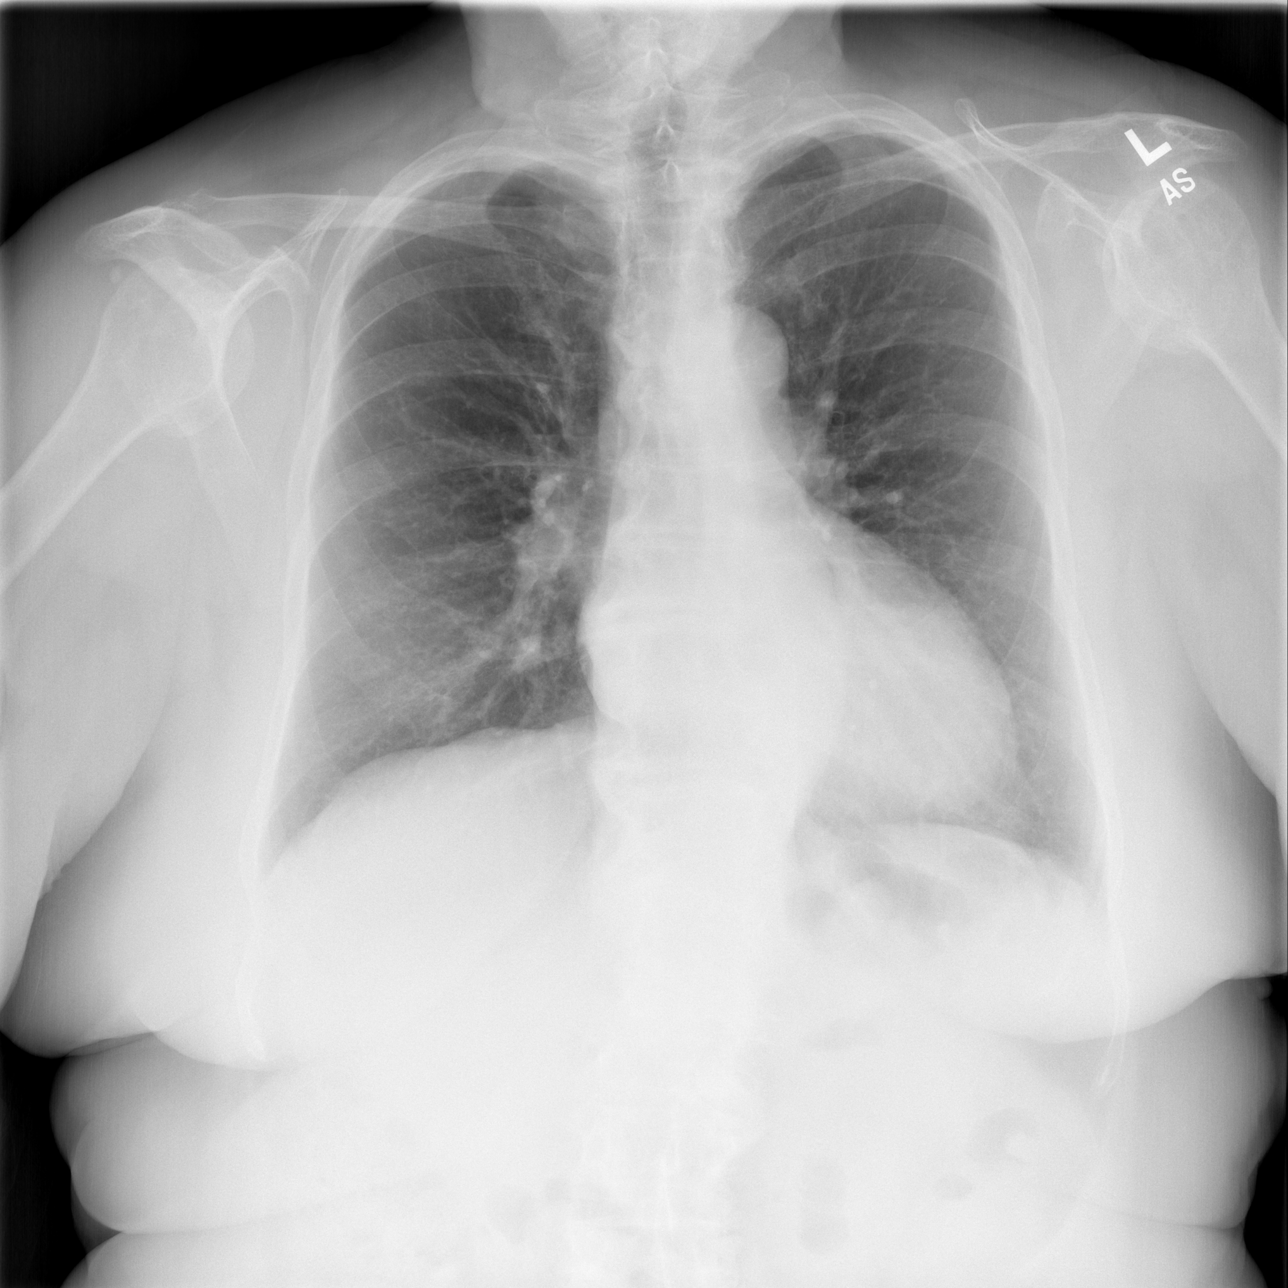

[w ribs ap/pa lower left *]
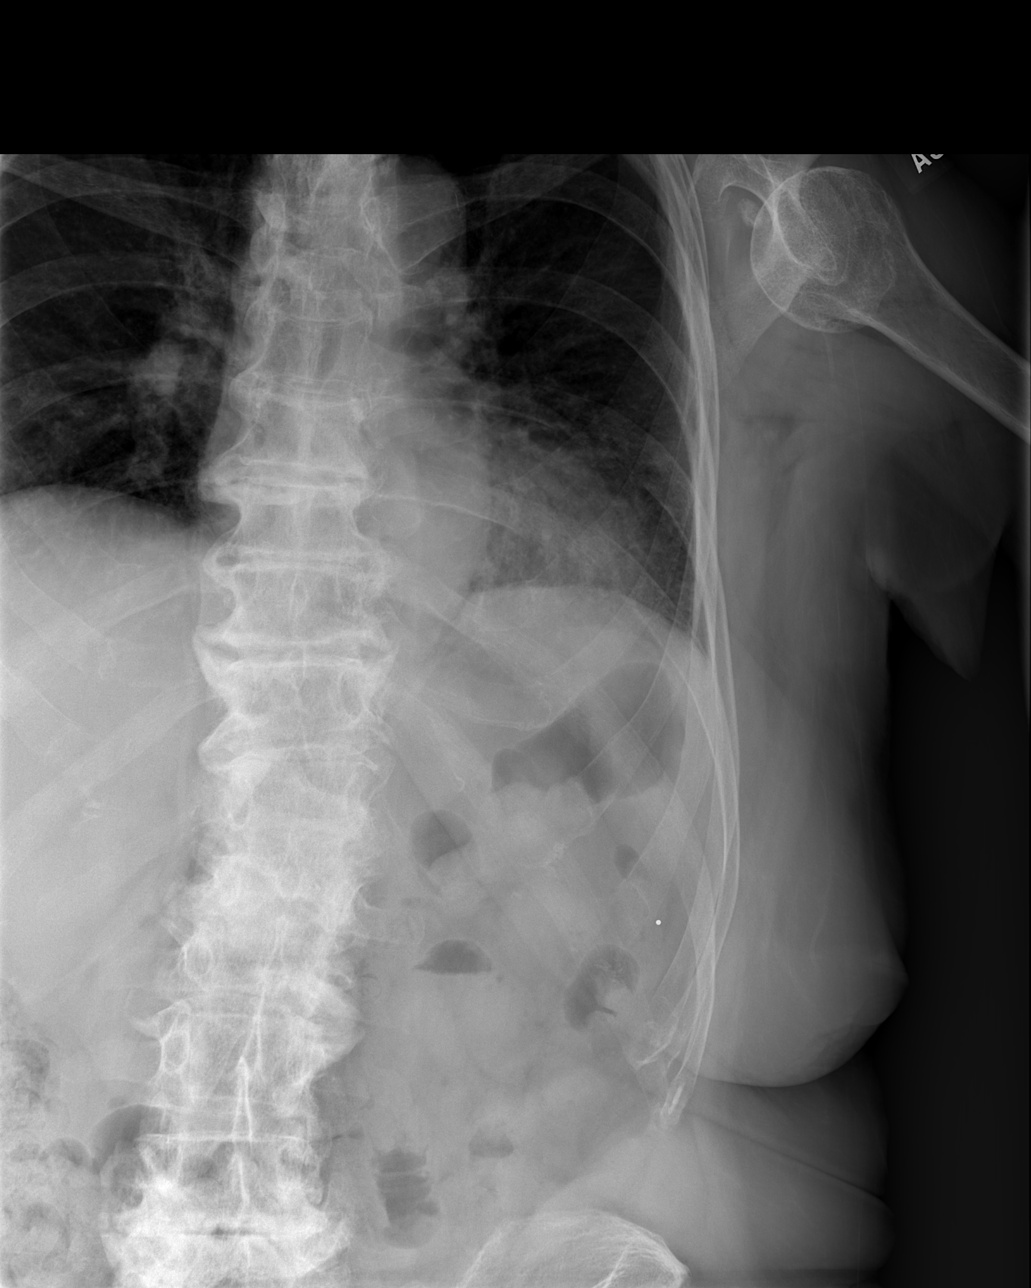

[w ribs oblique left *]
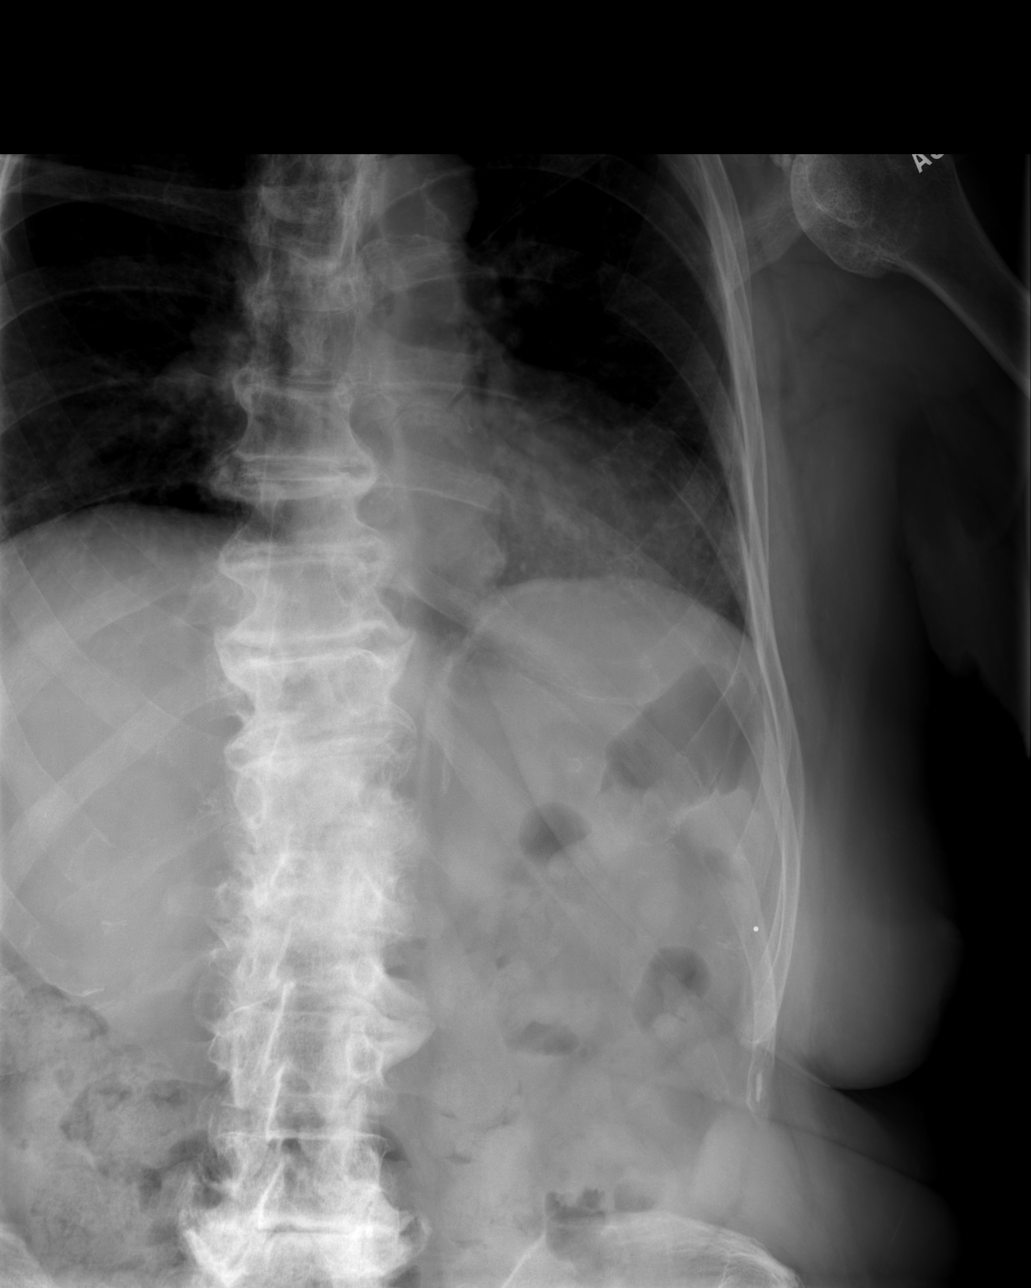

[3 of 3 positions shown; findings below may reference images not displayed]

FINDINGS: No fracture or other bone lesions are seen involving the ribs. There
is no evidence of pneumothorax or pleural effusion. Both lungs are
clear. Heart size and mediastinal contours are within normal limits.
IMPRESSION: Negative.

## 2020-09-20 DIAGNOSIS — Z20822 Contact with and (suspected) exposure to covid-19: Secondary | ICD-10-CM | POA: Diagnosis not present

## 2020-09-24 DIAGNOSIS — R05 Cough: Secondary | ICD-10-CM | POA: Diagnosis not present

## 2020-09-27 ENCOUNTER — Ambulatory Visit (HOSPITAL_COMMUNITY)
Admission: RE | Admit: 2020-09-27 | Discharge: 2020-09-27 | Disposition: A | Discharge status: Home / Self Care | Payer: Medicare Other | Source: Ambulatory Visit | Attending: Pulmonary Disease | Admitting: Pulmonary Disease

## 2020-09-27 ENCOUNTER — Other Ambulatory Visit: Payer: Self-pay | Admitting: Oncology

## 2020-09-27 ENCOUNTER — Other Ambulatory Visit (HOSPITAL_COMMUNITY): Payer: Self-pay

## 2020-09-27 DIAGNOSIS — Z23 Encounter for immunization: Secondary | ICD-10-CM | POA: Diagnosis not present

## 2020-09-27 DIAGNOSIS — U071 COVID-19: Secondary | ICD-10-CM

## 2020-09-27 MED ORDER — EPINEPHRINE 0.3 MG/0.3ML IJ SOAJ: 0.3000 mg | Freq: Once | INTRAMUSCULAR | Status: DC | PRN

## 2020-09-27 MED ORDER — SODIUM CHLORIDE 0.9 % IV SOLN: INTRAVENOUS | Status: DC | PRN

## 2020-09-27 MED ORDER — METHYLPREDNISOLONE SODIUM SUCC 125 MG IJ SOLR: 125.0000 mg | Freq: Once | INTRAMUSCULAR | Status: DC | PRN

## 2020-09-27 MED ORDER — DIPHENHYDRAMINE HCL 50 MG/ML IJ SOLN: 50.0000 mg | Freq: Once | INTRAMUSCULAR | Status: DC | PRN

## 2020-09-27 MED ORDER — ALBUTEROL SULFATE HFA 108 (90 BASE) MCG/ACT IN AERS: 2.0000 | INHALATION_SPRAY | Freq: Once | RESPIRATORY_TRACT | Status: DC | PRN

## 2020-09-27 MED ORDER — FAMOTIDINE IN NACL 20-0.9 MG/50ML-% IV SOLN: 20.0000 mg | Freq: Once | INTRAVENOUS | Status: DC | PRN

## 2020-09-27 MED ORDER — CASIRIVIMAB-IMDEVIMAB 600-600 MG/10ML IJ SOLN
1200.0000 mg | Freq: Once | INTRAMUSCULAR | Status: AC
  Administered 2020-09-27: 1200 mg via INTRAVENOUS

## 2020-09-27 NOTE — Progress Notes (Signed)
  Diagnosis: COVID-19  Physician: Dr. Joya Gaskins  Procedure: Covid Infusion Clinic Med: casirivimab\imdevimab infusion - Provided patient with casirivimab\imdevimab fact sheet for patients, parents and caregivers prior to infusion.  Complications: No immediate complications noted.  Discharge: Discharged home   Marissa Gallegos 09/27/2020

## 2020-09-27 NOTE — Discharge Instructions (Signed)

## 2020-09-27 NOTE — Progress Notes (Signed)
I connected by phone with  Marissa Gallegos to discuss the potential use of an new treatment for mild to moderate COVID-19 viral infection in non-hospitalized patients.   This patient is a age/sex that meets the FDA criteria for Emergency Use Authorization of casirivimab\imdevimab.  Has a (+) direct SARS-CoV-2 viral test result 1. Has mild or moderate COVID-19  2. Is ? 78 years of age and weighs ? 40 kg 3. Is NOT hospitalized due to COVID-19 4. Is NOT requiring oxygen therapy or requiring an increase in baseline oxygen flow rate due to COVID-19 5. Is within 10 days of symptom onset 6. Has at least one of the high risk factor(s) for progression to severe COVID-19 and/or hospitalization as defined in EUA. Specific high risk criteria : Past Medical History:  Diagnosis Date  . Complication of anesthesia    poss low BP with anesthesia,vagal  . GERD (gastroesophageal reflux disease)   . Hypertension    dr Alfonso Patten. Kenton Kingfisher  . Vertigo   ?  ?    Symptom onset  09/24/20   I have spoken and communicated the following to the patient or parent/caregiver:   1. FDA has authorized the emergency use of casirivimab\imdevimab for the treatment of mild to moderate COVID-19 in adults and pediatric patients with positive results of direct SARS-CoV-2 viral testing who are 78 years of age and older weighing at least 40 kg, and who are at high risk for progressing to severe COVID-19 and/or hospitalization.   2. The significant known and potential risks and benefits of casirivimab\imdevimab, and the extent to which such potential risks and benefits are unknown.   3. Information on available alternative treatments and the risks and benefits of those alternatives, including clinical trials.   4. Patients treated with casirivimab\imdevimab should continue to self-isolate and use infection control measures (e.g., wear mask, isolate, social distance, avoid sharing personal items, clean and disinfect "high touch" surfaces, and  frequent handwashing) according to CDC guidelines.    5. The patient or parent/caregiver has the option to accept or refuse casirivimab\imdevimab .   After reviewing this information with the patient, The patient agreed to proceed with receiving casirivimab\imdevimab infusion and will be provided a copy of the Fact sheet prior to receiving the infusion.Rulon Abide, AGNP-C [phone] ([place])

## 2020-12-05 ENCOUNTER — Encounter (HOSPITAL_COMMUNITY): Payer: Self-pay | Admitting: Emergency Medicine

## 2020-12-05 ENCOUNTER — Ambulatory Visit (HOSPITAL_COMMUNITY)
Admission: EM | Admit: 2020-12-05 | Discharge: 2020-12-05 | Disposition: A | Discharge status: Home / Self Care | Payer: Medicare Other | Attending: Family Medicine | Admitting: Family Medicine

## 2020-12-05 ENCOUNTER — Other Ambulatory Visit: Payer: Self-pay

## 2020-12-05 DIAGNOSIS — M779 Enthesopathy, unspecified: Secondary | ICD-10-CM | POA: Diagnosis not present

## 2020-12-05 DIAGNOSIS — M79601 Pain in right arm: Secondary | ICD-10-CM | POA: Diagnosis not present

## 2020-12-05 MED ORDER — CYCLOBENZAPRINE HCL 5 MG PO TABS: 5.0000 mg | ORAL_TABLET | Freq: Every evening | ORAL | 0 refills | Status: AC | PRN

## 2020-12-05 MED ORDER — TRIAMCINOLONE ACETONIDE 40 MG/ML IJ SUSP
40.0000 mg | Freq: Once | INTRAMUSCULAR | Status: AC
  Administered 2020-12-05: 40 mg via INTRAMUSCULAR

## 2020-12-05 MED ORDER — TRIAMCINOLONE ACETONIDE 40 MG/ML IJ SUSP
INTRAMUSCULAR | Status: AC
  Filled 2020-12-05: qty 1

## 2020-12-05 NOTE — ED Triage Notes (Signed)
Pt presents with right arm pain. States was putting Christmas tree but does not recall injury. States rotation of the wrist or bending causing severe pain.

## 2020-12-05 NOTE — ED Provider Notes (Signed)
[facility]    CSN: [id number] Arrival date & time: 12/05/20  5732      History   Chief Complaint Chief Complaint  Patient presents with  . Arm Pain    HPI Marissa Gallegos is a 79 y.o. female.   Here today for right arm pain that started yesterday afternoon after a full day of christmas decorating, cleaning the house and putting up the christmas tree. States twisting her hand at the wrist is excruciating sharp pain and at rest a dull toothache type pain. Denies swelling, discoloration, known injury, numbness, tingling, pain up into the arm, CP, SOB, N/V. So far has tried ibuprofen and ace wrap with mild temporary relief.      Past Medical History:  Diagnosis Date  . Complication of anesthesia    poss low BP with anesthesia,vagal  . GERD (gastroesophageal reflux disease)   . Hypertension    dr Alfonso Patten. Kenton Kingfisher  . Vertigo     Patient Active Problem List   Diagnosis Date Noted  . 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 NO PAST SURGERIES    . 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      Home Medications    Prior to Admission medications   Medication Sig Start Date End Date Taking? Authorizing Provider  ALENDRONATE SODIUM PO Take by mouth.   Yes [provider]  amLODipine (NORVASC) 10 MG tablet Take 10 mg by mouth daily.   Yes [provider]  Calcium Carbonate-Vit D-Min (CALTRATE 600+D PLUS PO) Take 1 tablet by mouth 2 (two) times daily.   Yes [provider]  Cholecalciferol (VITAMIN D-3) 1000 UNITS CAPS Take 1,000-2,000 Units by mouth 2 (two) times daily. 2000 units in the morning and 1000 units in the evening   Yes [provider]  cloNIDine (CATAPRES) 0.1 MG tablet Take 0.1 mg by mouth 2 (two) times  daily.   Yes [provider]  esomeprazole (NEXIUM) 40 MG capsule Take 40 mg by mouth 2 (two) times daily before a meal.   Yes [provider]  fluticasone (FLONASE) 50 MCG/ACT nasal spray Place into both nostrils daily.   Yes [provider]  hydrochlorothiazide (HYDRODIURIL) 25 MG tablet Take 25 mg by mouth daily.   Yes [provider]  losartan (COZAAR) 100 MG tablet Take 100 mg by mouth daily.   Yes [provider]  Magnesium 250 MG TABS Take 250 mg by mouth 2 (two) times daily.   Yes [provider]  Multiple Vitamins-Minerals (CENTRUM PO) Take 1 tablet by mouth daily.   Yes [provider]  aspirin EC 325 MG EC tablet Take 1 tablet (325 mg total) by mouth 2 (two) times daily after a meal. 04/27/14   Loni Dolly, PA-C  clobetasol cream (TEMOVATE) 2.02 % Apply 1 application topically 2 (two) times daily.    [provider]  cyclobenzaprine (FLEXERIL) 5 MG tablet Take 1 tablet (5 mg total) by mouth at bedtime as needed for muscle spasms. DO NOT DRINK ALCOHOL OR DRIVE WHILE TAKING THIS MEDICATION 12/05/20   Volney American, PA-C  HYDROcodone-acetaminophen (NORCO/VICODIN) 5-325 MG per tablet Take 1-2 tablets by mouth every 6 (six) hours as needed for moderate pain (breakthrough pain). 04/27/14  Loni Dolly, PA-C  norethindrone-ethinyl estradiol (JINTELI) 1-5 MG-MCG TABS tablet Take 1 tablet by mouth daily.     [provider]    Family History History reviewed. No pertinent family history.  Social History Social History   Tobacco Use  . Smoking status: Never Smoker  . Smokeless tobacco: Never Used  Substance Use Topics  . Alcohol use: Yes    Comment: occ  . Drug use: Not on file     Allergies   Patient has no allergy information on record.   Review of Systems Review of Systems PER HPI    Physical Exam Triage Vital Signs ED Triage Vitals  Enc Vitals Group     BP 12/05/20 0906 (!) 163/96      Pulse Rate 12/05/20 0906 60     Resp 12/05/20 0906 17     Temp 12/05/20 0906 97.7 F (36.5 C)     Temp Source 12/05/20 0906 Oral     SpO2 12/05/20 0906 99 %     Weight --      Height --      Head Circumference --      Peak Flow --      Pain Score 12/05/20 0902 5     Pain Loc --      Pain Edu? --      Excl. in [place]? --    No data found.  Updated Vital Signs BP (!) 163/96 (BP Location: Right Arm)   Pulse 60   Temp 97.7 F (36.5 C) (Oral)   Resp 17   SpO2 99%   Visual Acuity Right Eye Distance:   Left Eye Distance:   Bilateral Distance:    Right Eye Near:   Left Eye Near:    Bilateral Near:     Physical Exam Vitals and nursing note reviewed.  Constitutional:      Appearance: Normal appearance. She is not ill-appearing.  HENT:     Head: Atraumatic.     Mouth/Throat:     Mouth: Mucous membranes are moist.     Pharynx: Oropharynx is clear.  Eyes:     Extraocular Movements: Extraocular movements intact.     Conjunctiva/sclera: Conjunctivae normal.  Cardiovascular:     Rate and Rhythm: Normal rate and regular rhythm.     Pulses: Normal pulses.     Heart sounds: Normal heart sounds.  Pulmonary:     Effort: Pulmonary effort is normal.     Breath sounds: Normal breath sounds.  Abdominal:     General: Bowel sounds are normal. There is no distension.     Palpations: Abdomen is soft.     Tenderness: There is no abdominal tenderness. There is no guarding.  Musculoskeletal:        General: Tenderness (ttp distal forearm extensor surface) present. Normal range of motion.     Cervical back: Normal range of motion and neck supple.     Comments: ROM intact though significantly painful with rotation at wrist right UE. Grip strength 2/5 also due to pain No swelling, discoloration  Skin:    General: Skin is warm and dry.  Neurological:     Mental Status: She is alert and oriented to person, place, and time.     Comments: RUE neurovascularly intact   Psychiatric:         Mood and Affect: Mood normal.        Thought Content: Thought content normal.        Judgment: Judgment normal.  UC Treatments / Results  Labs (all labs ordered are listed, but only abnormal results are displayed) Labs Reviewed - No data to display  EKG   Radiology No results found.  Procedures Procedures (including critical care time)  Medications Ordered in UC Medications  triamcinolone acetonide (KENALOG-40) injection 40 mg (40 mg Intramuscular Given 12/05/20 1001)    Initial Impression / Assessment and Plan / UC Course  I have reviewed the triage vital signs and the nursing notes.  Pertinent labs & imaging results that were available during my care of the patient were reviewed by me and considered in my medical decision making (see chart for details).     Suspect over-use tendonitis - tx with IM kenalog (given in clinic), low dose flexeril, OTC pain relievers, RICE. Sports Medicine f/u info given in case worsening or not resolving.   Final Clinical Impressions(s) / UC Diagnoses   Final diagnoses:  Right arm pain  Tendonitis   Discharge Instructions   None    ED Prescriptions    Medication Sig Dispense Auth. Provider   cyclobenzaprine (FLEXERIL) 5 MG tablet Take 1 tablet (5 mg total) by mouth at bedtime as needed for muscle spasms. DO NOT DRINK ALCOHOL OR DRIVE WHILE TAKING THIS MEDICATION 30 tablet Volney American, [place]     PDMP not reviewed this encounter.   Volney American, [place] 12/05/20 1029

## 2020-12-11 DIAGNOSIS — Z23 Encounter for immunization: Secondary | ICD-10-CM | POA: Diagnosis not present

## 2020-12-26 DIAGNOSIS — Z23 Encounter for immunization: Secondary | ICD-10-CM | POA: Diagnosis not present

## 2021-04-17 DIAGNOSIS — N390 Urinary tract infection, site not specified: Secondary | ICD-10-CM | POA: Diagnosis not present

## 2021-04-17 DIAGNOSIS — R300 Dysuria: Secondary | ICD-10-CM | POA: Diagnosis not present

## 2021-05-06 DIAGNOSIS — Z23 Encounter for immunization: Secondary | ICD-10-CM | POA: Diagnosis not present

## 2021-05-23 DIAGNOSIS — E559 Vitamin D deficiency, unspecified: Secondary | ICD-10-CM | POA: Diagnosis not present

## 2021-05-23 DIAGNOSIS — I10 Essential (primary) hypertension: Secondary | ICD-10-CM | POA: Diagnosis not present

## 2021-05-23 DIAGNOSIS — E210 Primary hyperparathyroidism: Secondary | ICD-10-CM | POA: Diagnosis not present

## 2021-05-23 DIAGNOSIS — K219 Gastro-esophageal reflux disease without esophagitis: Secondary | ICD-10-CM | POA: Diagnosis not present

## 2021-05-23 DIAGNOSIS — R430 Anosmia: Secondary | ICD-10-CM | POA: Diagnosis not present

## 2021-05-23 DIAGNOSIS — R7303 Prediabetes: Secondary | ICD-10-CM | POA: Diagnosis not present

## 2021-05-31 DIAGNOSIS — Z20828 Contact with and (suspected) exposure to other viral communicable diseases: Secondary | ICD-10-CM | POA: Diagnosis not present

## 2021-09-22 DIAGNOSIS — Z23 Encounter for immunization: Secondary | ICD-10-CM | POA: Diagnosis not present

## 2021-10-18 DIAGNOSIS — Z23 Encounter for immunization: Secondary | ICD-10-CM | POA: Diagnosis not present

## 2021-12-11 DIAGNOSIS — R7303 Prediabetes: Secondary | ICD-10-CM | POA: Diagnosis not present

## 2021-12-11 DIAGNOSIS — M810 Age-related osteoporosis without current pathological fracture: Secondary | ICD-10-CM | POA: Diagnosis not present

## 2021-12-11 DIAGNOSIS — I10 Essential (primary) hypertension: Secondary | ICD-10-CM | POA: Diagnosis not present

## 2021-12-11 DIAGNOSIS — E559 Vitamin D deficiency, unspecified: Secondary | ICD-10-CM | POA: Diagnosis not present

## 2021-12-11 DIAGNOSIS — Z0000 Encounter for general adult medical examination without abnormal findings: Secondary | ICD-10-CM | POA: Diagnosis not present

## 2021-12-11 DIAGNOSIS — K219 Gastro-esophageal reflux disease without esophagitis: Secondary | ICD-10-CM | POA: Diagnosis not present

## 2021-12-11 DIAGNOSIS — K429 Umbilical hernia without obstruction or gangrene: Secondary | ICD-10-CM | POA: Diagnosis not present

## 2021-12-11 DIAGNOSIS — R430 Anosmia: Secondary | ICD-10-CM | POA: Diagnosis not present

## 2022-01-08 DIAGNOSIS — M25562 Pain in left knee: Secondary | ICD-10-CM | POA: Diagnosis not present

## 2022-01-10 DIAGNOSIS — K439 Ventral hernia without obstruction or gangrene: Secondary | ICD-10-CM | POA: Diagnosis not present

## 2022-01-22 DIAGNOSIS — H00011 Hordeolum externum right upper eyelid: Secondary | ICD-10-CM | POA: Diagnosis not present

## 2022-01-30 DIAGNOSIS — M170 Bilateral primary osteoarthritis of knee: Secondary | ICD-10-CM | POA: Diagnosis not present

## 2022-01-30 DIAGNOSIS — I10 Essential (primary) hypertension: Secondary | ICD-10-CM | POA: Diagnosis not present

## 2022-02-10 DIAGNOSIS — M25562 Pain in left knee: Secondary | ICD-10-CM | POA: Diagnosis not present

## 2022-02-12 DIAGNOSIS — M1712 Unilateral primary osteoarthritis, left knee: Secondary | ICD-10-CM | POA: Diagnosis not present

## 2022-02-12 DIAGNOSIS — R531 Weakness: Secondary | ICD-10-CM | POA: Diagnosis not present

## 2022-02-20 DIAGNOSIS — M1712 Unilateral primary osteoarthritis, left knee: Secondary | ICD-10-CM | POA: Diagnosis not present

## 2022-02-20 DIAGNOSIS — Z96652 Presence of left artificial knee joint: Secondary | ICD-10-CM | POA: Diagnosis not present

## 2022-02-20 DIAGNOSIS — G8918 Other acute postprocedural pain: Secondary | ICD-10-CM | POA: Diagnosis not present

## 2022-02-21 DIAGNOSIS — Z96653 Presence of artificial knee joint, bilateral: Secondary | ICD-10-CM | POA: Diagnosis not present

## 2022-02-21 DIAGNOSIS — Z96652 Presence of left artificial knee joint: Secondary | ICD-10-CM | POA: Diagnosis not present

## 2022-02-21 DIAGNOSIS — Z471 Aftercare following joint replacement surgery: Secondary | ICD-10-CM | POA: Diagnosis not present

## 2022-02-21 DIAGNOSIS — I10 Essential (primary) hypertension: Secondary | ICD-10-CM | POA: Diagnosis not present

## 2022-02-21 DIAGNOSIS — Z7982 Long term (current) use of aspirin: Secondary | ICD-10-CM | POA: Diagnosis not present

## 2022-02-23 DIAGNOSIS — I10 Essential (primary) hypertension: Secondary | ICD-10-CM | POA: Diagnosis not present

## 2022-02-23 DIAGNOSIS — Z96653 Presence of artificial knee joint, bilateral: Secondary | ICD-10-CM | POA: Diagnosis not present

## 2022-02-23 DIAGNOSIS — Z7982 Long term (current) use of aspirin: Secondary | ICD-10-CM | POA: Diagnosis not present

## 2022-02-23 DIAGNOSIS — Z471 Aftercare following joint replacement surgery: Secondary | ICD-10-CM | POA: Diagnosis not present

## 2022-02-24 DIAGNOSIS — Z96653 Presence of artificial knee joint, bilateral: Secondary | ICD-10-CM | POA: Diagnosis not present

## 2022-02-24 DIAGNOSIS — Z471 Aftercare following joint replacement surgery: Secondary | ICD-10-CM | POA: Diagnosis not present

## 2022-02-24 DIAGNOSIS — I10 Essential (primary) hypertension: Secondary | ICD-10-CM | POA: Diagnosis not present

## 2022-02-24 DIAGNOSIS — Z7982 Long term (current) use of aspirin: Secondary | ICD-10-CM | POA: Diagnosis not present

## 2022-02-26 DIAGNOSIS — I10 Essential (primary) hypertension: Secondary | ICD-10-CM | POA: Diagnosis not present

## 2022-02-26 DIAGNOSIS — Z471 Aftercare following joint replacement surgery: Secondary | ICD-10-CM | POA: Diagnosis not present

## 2022-02-26 DIAGNOSIS — Z96653 Presence of artificial knee joint, bilateral: Secondary | ICD-10-CM | POA: Diagnosis not present

## 2022-02-26 DIAGNOSIS — Z7982 Long term (current) use of aspirin: Secondary | ICD-10-CM | POA: Diagnosis not present

## 2022-02-28 DIAGNOSIS — I10 Essential (primary) hypertension: Secondary | ICD-10-CM | POA: Diagnosis not present

## 2022-02-28 DIAGNOSIS — Z96653 Presence of artificial knee joint, bilateral: Secondary | ICD-10-CM | POA: Diagnosis not present

## 2022-02-28 DIAGNOSIS — Z471 Aftercare following joint replacement surgery: Secondary | ICD-10-CM | POA: Diagnosis not present

## 2022-02-28 DIAGNOSIS — Z7982 Long term (current) use of aspirin: Secondary | ICD-10-CM | POA: Diagnosis not present

## 2022-03-02 DIAGNOSIS — I10 Essential (primary) hypertension: Secondary | ICD-10-CM | POA: Diagnosis not present

## 2022-03-02 DIAGNOSIS — Z7982 Long term (current) use of aspirin: Secondary | ICD-10-CM | POA: Diagnosis not present

## 2022-03-02 DIAGNOSIS — Z471 Aftercare following joint replacement surgery: Secondary | ICD-10-CM | POA: Diagnosis not present

## 2022-03-02 DIAGNOSIS — Z96653 Presence of artificial knee joint, bilateral: Secondary | ICD-10-CM | POA: Diagnosis not present

## 2022-03-03 DIAGNOSIS — R262 Difficulty in walking, not elsewhere classified: Secondary | ICD-10-CM | POA: Diagnosis not present

## 2022-03-03 DIAGNOSIS — R531 Weakness: Secondary | ICD-10-CM | POA: Diagnosis not present

## 2022-03-03 DIAGNOSIS — M25562 Pain in left knee: Secondary | ICD-10-CM | POA: Diagnosis not present

## 2022-03-03 DIAGNOSIS — Z9889 Other specified postprocedural states: Secondary | ICD-10-CM | POA: Diagnosis not present

## 2022-03-03 DIAGNOSIS — Z96652 Presence of left artificial knee joint: Secondary | ICD-10-CM | POA: Diagnosis not present

## 2022-03-03 DIAGNOSIS — M25662 Stiffness of left knee, not elsewhere classified: Secondary | ICD-10-CM | POA: Diagnosis not present

## 2022-03-05 DIAGNOSIS — Z96652 Presence of left artificial knee joint: Secondary | ICD-10-CM | POA: Diagnosis not present

## 2022-03-05 DIAGNOSIS — R531 Weakness: Secondary | ICD-10-CM | POA: Diagnosis not present

## 2022-03-05 DIAGNOSIS — R262 Difficulty in walking, not elsewhere classified: Secondary | ICD-10-CM | POA: Diagnosis not present

## 2022-03-05 DIAGNOSIS — M25662 Stiffness of left knee, not elsewhere classified: Secondary | ICD-10-CM | POA: Diagnosis not present

## 2022-03-11 DIAGNOSIS — M25662 Stiffness of left knee, not elsewhere classified: Secondary | ICD-10-CM | POA: Diagnosis not present

## 2022-03-11 DIAGNOSIS — Z96652 Presence of left artificial knee joint: Secondary | ICD-10-CM | POA: Diagnosis not present

## 2022-03-11 DIAGNOSIS — R531 Weakness: Secondary | ICD-10-CM | POA: Diagnosis not present

## 2022-03-11 DIAGNOSIS — R262 Difficulty in walking, not elsewhere classified: Secondary | ICD-10-CM | POA: Diagnosis not present

## 2022-03-13 DIAGNOSIS — S29012D Strain of muscle and tendon of back wall of thorax, subsequent encounter: Secondary | ICD-10-CM | POA: Diagnosis not present

## 2022-03-13 DIAGNOSIS — M545 Low back pain, unspecified: Secondary | ICD-10-CM | POA: Diagnosis not present

## 2022-03-13 DIAGNOSIS — M47896 Other spondylosis, lumbar region: Secondary | ICD-10-CM | POA: Diagnosis not present

## 2022-03-13 DIAGNOSIS — S39012D Strain of muscle, fascia and tendon of lower back, subsequent encounter: Secondary | ICD-10-CM | POA: Diagnosis not present

## 2022-03-17 DIAGNOSIS — S39012D Strain of muscle, fascia and tendon of lower back, subsequent encounter: Secondary | ICD-10-CM | POA: Diagnosis not present

## 2022-03-17 DIAGNOSIS — M47896 Other spondylosis, lumbar region: Secondary | ICD-10-CM | POA: Diagnosis not present

## 2022-03-17 DIAGNOSIS — S29012D Strain of muscle and tendon of back wall of thorax, subsequent encounter: Secondary | ICD-10-CM | POA: Diagnosis not present

## 2022-03-18 DIAGNOSIS — M25662 Stiffness of left knee, not elsewhere classified: Secondary | ICD-10-CM | POA: Diagnosis not present

## 2022-03-18 DIAGNOSIS — R262 Difficulty in walking, not elsewhere classified: Secondary | ICD-10-CM | POA: Diagnosis not present

## 2022-03-18 DIAGNOSIS — Z96652 Presence of left artificial knee joint: Secondary | ICD-10-CM | POA: Diagnosis not present

## 2022-03-18 DIAGNOSIS — R531 Weakness: Secondary | ICD-10-CM | POA: Diagnosis not present

## 2022-03-19 DIAGNOSIS — S29012D Strain of muscle and tendon of back wall of thorax, subsequent encounter: Secondary | ICD-10-CM | POA: Diagnosis not present

## 2022-03-19 DIAGNOSIS — S39012D Strain of muscle, fascia and tendon of lower back, subsequent encounter: Secondary | ICD-10-CM | POA: Diagnosis not present

## 2022-03-19 DIAGNOSIS — M47896 Other spondylosis, lumbar region: Secondary | ICD-10-CM | POA: Diagnosis not present

## 2022-03-21 DIAGNOSIS — R262 Difficulty in walking, not elsewhere classified: Secondary | ICD-10-CM | POA: Diagnosis not present

## 2022-03-21 DIAGNOSIS — R531 Weakness: Secondary | ICD-10-CM | POA: Diagnosis not present

## 2022-03-21 DIAGNOSIS — M25662 Stiffness of left knee, not elsewhere classified: Secondary | ICD-10-CM | POA: Diagnosis not present

## 2022-03-21 DIAGNOSIS — Z96652 Presence of left artificial knee joint: Secondary | ICD-10-CM | POA: Diagnosis not present

## 2022-03-24 DIAGNOSIS — M47896 Other spondylosis, lumbar region: Secondary | ICD-10-CM | POA: Diagnosis not present

## 2022-03-24 DIAGNOSIS — S39012D Strain of muscle, fascia and tendon of lower back, subsequent encounter: Secondary | ICD-10-CM | POA: Diagnosis not present

## 2022-03-24 DIAGNOSIS — S29012D Strain of muscle and tendon of back wall of thorax, subsequent encounter: Secondary | ICD-10-CM | POA: Diagnosis not present

## 2022-03-25 DIAGNOSIS — Z96652 Presence of left artificial knee joint: Secondary | ICD-10-CM | POA: Diagnosis not present

## 2022-03-25 DIAGNOSIS — R531 Weakness: Secondary | ICD-10-CM | POA: Diagnosis not present

## 2022-03-25 DIAGNOSIS — R262 Difficulty in walking, not elsewhere classified: Secondary | ICD-10-CM | POA: Diagnosis not present

## 2022-03-25 DIAGNOSIS — M25662 Stiffness of left knee, not elsewhere classified: Secondary | ICD-10-CM | POA: Diagnosis not present

## 2022-03-26 DIAGNOSIS — S29012D Strain of muscle and tendon of back wall of thorax, subsequent encounter: Secondary | ICD-10-CM | POA: Diagnosis not present

## 2022-03-26 DIAGNOSIS — S39012D Strain of muscle, fascia and tendon of lower back, subsequent encounter: Secondary | ICD-10-CM | POA: Diagnosis not present

## 2022-03-26 DIAGNOSIS — M47896 Other spondylosis, lumbar region: Secondary | ICD-10-CM | POA: Diagnosis not present

## 2022-03-27 DIAGNOSIS — M25662 Stiffness of left knee, not elsewhere classified: Secondary | ICD-10-CM | POA: Diagnosis not present

## 2022-03-27 DIAGNOSIS — Z96652 Presence of left artificial knee joint: Secondary | ICD-10-CM | POA: Diagnosis not present

## 2022-03-27 DIAGNOSIS — R262 Difficulty in walking, not elsewhere classified: Secondary | ICD-10-CM | POA: Diagnosis not present

## 2022-03-27 DIAGNOSIS — R531 Weakness: Secondary | ICD-10-CM | POA: Diagnosis not present

## 2022-04-01 DIAGNOSIS — M25662 Stiffness of left knee, not elsewhere classified: Secondary | ICD-10-CM | POA: Diagnosis not present

## 2022-04-01 DIAGNOSIS — R531 Weakness: Secondary | ICD-10-CM | POA: Diagnosis not present

## 2022-04-01 DIAGNOSIS — R262 Difficulty in walking, not elsewhere classified: Secondary | ICD-10-CM | POA: Diagnosis not present

## 2022-04-01 DIAGNOSIS — Z96652 Presence of left artificial knee joint: Secondary | ICD-10-CM | POA: Diagnosis not present

## 2022-04-03 DIAGNOSIS — M25662 Stiffness of left knee, not elsewhere classified: Secondary | ICD-10-CM | POA: Diagnosis not present

## 2022-04-03 DIAGNOSIS — R531 Weakness: Secondary | ICD-10-CM | POA: Diagnosis not present

## 2022-04-03 DIAGNOSIS — R262 Difficulty in walking, not elsewhere classified: Secondary | ICD-10-CM | POA: Diagnosis not present

## 2022-04-03 DIAGNOSIS — Z96652 Presence of left artificial knee joint: Secondary | ICD-10-CM | POA: Diagnosis not present

## 2022-04-07 DIAGNOSIS — R531 Weakness: Secondary | ICD-10-CM | POA: Diagnosis not present

## 2022-04-07 DIAGNOSIS — M25662 Stiffness of left knee, not elsewhere classified: Secondary | ICD-10-CM | POA: Diagnosis not present

## 2022-04-07 DIAGNOSIS — Z96652 Presence of left artificial knee joint: Secondary | ICD-10-CM | POA: Diagnosis not present

## 2022-04-07 DIAGNOSIS — R262 Difficulty in walking, not elsewhere classified: Secondary | ICD-10-CM | POA: Diagnosis not present

## 2022-04-09 DIAGNOSIS — M47896 Other spondylosis, lumbar region: Secondary | ICD-10-CM | POA: Diagnosis not present

## 2022-04-09 DIAGNOSIS — S39012D Strain of muscle, fascia and tendon of lower back, subsequent encounter: Secondary | ICD-10-CM | POA: Diagnosis not present

## 2022-04-09 DIAGNOSIS — S29012D Strain of muscle and tendon of back wall of thorax, subsequent encounter: Secondary | ICD-10-CM | POA: Diagnosis not present

## 2022-04-15 DIAGNOSIS — Z96652 Presence of left artificial knee joint: Secondary | ICD-10-CM | POA: Diagnosis not present

## 2022-04-15 DIAGNOSIS — R262 Difficulty in walking, not elsewhere classified: Secondary | ICD-10-CM | POA: Diagnosis not present

## 2022-04-15 DIAGNOSIS — M25662 Stiffness of left knee, not elsewhere classified: Secondary | ICD-10-CM | POA: Diagnosis not present

## 2022-04-15 DIAGNOSIS — R531 Weakness: Secondary | ICD-10-CM | POA: Diagnosis not present

## 2022-04-17 DIAGNOSIS — R531 Weakness: Secondary | ICD-10-CM | POA: Diagnosis not present

## 2022-04-17 DIAGNOSIS — R262 Difficulty in walking, not elsewhere classified: Secondary | ICD-10-CM | POA: Diagnosis not present

## 2022-04-17 DIAGNOSIS — Z96652 Presence of left artificial knee joint: Secondary | ICD-10-CM | POA: Diagnosis not present

## 2022-04-17 DIAGNOSIS — M25662 Stiffness of left knee, not elsewhere classified: Secondary | ICD-10-CM | POA: Diagnosis not present

## 2022-04-22 DIAGNOSIS — Z96652 Presence of left artificial knee joint: Secondary | ICD-10-CM | POA: Diagnosis not present

## 2022-04-22 DIAGNOSIS — R531 Weakness: Secondary | ICD-10-CM | POA: Diagnosis not present

## 2022-04-22 DIAGNOSIS — M25662 Stiffness of left knee, not elsewhere classified: Secondary | ICD-10-CM | POA: Diagnosis not present

## 2022-04-22 DIAGNOSIS — R262 Difficulty in walking, not elsewhere classified: Secondary | ICD-10-CM | POA: Diagnosis not present

## 2022-04-24 DIAGNOSIS — R531 Weakness: Secondary | ICD-10-CM | POA: Diagnosis not present

## 2022-04-24 DIAGNOSIS — M25662 Stiffness of left knee, not elsewhere classified: Secondary | ICD-10-CM | POA: Diagnosis not present

## 2022-04-24 DIAGNOSIS — R262 Difficulty in walking, not elsewhere classified: Secondary | ICD-10-CM | POA: Diagnosis not present

## 2022-04-24 DIAGNOSIS — Z96652 Presence of left artificial knee joint: Secondary | ICD-10-CM | POA: Diagnosis not present

## 2022-04-28 DIAGNOSIS — R262 Difficulty in walking, not elsewhere classified: Secondary | ICD-10-CM | POA: Diagnosis not present

## 2022-04-28 DIAGNOSIS — R531 Weakness: Secondary | ICD-10-CM | POA: Diagnosis not present

## 2022-04-28 DIAGNOSIS — M25662 Stiffness of left knee, not elsewhere classified: Secondary | ICD-10-CM | POA: Diagnosis not present

## 2022-04-28 DIAGNOSIS — Z96652 Presence of left artificial knee joint: Secondary | ICD-10-CM | POA: Diagnosis not present

## 2022-04-30 DIAGNOSIS — R262 Difficulty in walking, not elsewhere classified: Secondary | ICD-10-CM | POA: Diagnosis not present

## 2022-04-30 DIAGNOSIS — M25662 Stiffness of left knee, not elsewhere classified: Secondary | ICD-10-CM | POA: Diagnosis not present

## 2022-04-30 DIAGNOSIS — R531 Weakness: Secondary | ICD-10-CM | POA: Diagnosis not present

## 2022-04-30 DIAGNOSIS — Z96652 Presence of left artificial knee joint: Secondary | ICD-10-CM | POA: Diagnosis not present

## 2022-05-06 DIAGNOSIS — R531 Weakness: Secondary | ICD-10-CM | POA: Diagnosis not present

## 2022-05-06 DIAGNOSIS — R262 Difficulty in walking, not elsewhere classified: Secondary | ICD-10-CM | POA: Diagnosis not present

## 2022-05-06 DIAGNOSIS — Z96652 Presence of left artificial knee joint: Secondary | ICD-10-CM | POA: Diagnosis not present

## 2022-05-21 DIAGNOSIS — Z471 Aftercare following joint replacement surgery: Secondary | ICD-10-CM | POA: Diagnosis not present

## 2022-05-21 DIAGNOSIS — Z96652 Presence of left artificial knee joint: Secondary | ICD-10-CM | POA: Diagnosis not present

## 2022-06-11 DIAGNOSIS — J301 Allergic rhinitis due to pollen: Secondary | ICD-10-CM | POA: Diagnosis not present

## 2022-06-11 DIAGNOSIS — G4700 Insomnia, unspecified: Secondary | ICD-10-CM | POA: Diagnosis not present

## 2022-06-11 DIAGNOSIS — N390 Urinary tract infection, site not specified: Secondary | ICD-10-CM | POA: Diagnosis not present

## 2022-06-11 DIAGNOSIS — M810 Age-related osteoporosis without current pathological fracture: Secondary | ICD-10-CM | POA: Diagnosis not present

## 2022-06-11 DIAGNOSIS — I10 Essential (primary) hypertension: Secondary | ICD-10-CM | POA: Diagnosis not present

## 2022-06-11 DIAGNOSIS — K219 Gastro-esophageal reflux disease without esophagitis: Secondary | ICD-10-CM | POA: Diagnosis not present

## 2022-06-11 DIAGNOSIS — R430 Anosmia: Secondary | ICD-10-CM | POA: Diagnosis not present

## 2022-06-11 DIAGNOSIS — M170 Bilateral primary osteoarthritis of knee: Secondary | ICD-10-CM | POA: Diagnosis not present

## 2022-06-17 DIAGNOSIS — H5203 Hypermetropia, bilateral: Secondary | ICD-10-CM | POA: Diagnosis not present

## 2022-06-17 DIAGNOSIS — H353131 Nonexudative age-related macular degeneration, bilateral, early dry stage: Secondary | ICD-10-CM | POA: Diagnosis not present

## 2022-06-17 DIAGNOSIS — H2513 Age-related nuclear cataract, bilateral: Secondary | ICD-10-CM | POA: Diagnosis not present

## 2022-06-17 DIAGNOSIS — H25013 Cortical age-related cataract, bilateral: Secondary | ICD-10-CM | POA: Diagnosis not present

## 2022-07-16 DIAGNOSIS — I10 Essential (primary) hypertension: Secondary | ICD-10-CM | POA: Diagnosis not present

## 2022-08-07 DIAGNOSIS — H269 Unspecified cataract: Secondary | ICD-10-CM | POA: Diagnosis not present

## 2022-08-07 DIAGNOSIS — H25012 Cortical age-related cataract, left eye: Secondary | ICD-10-CM | POA: Diagnosis not present

## 2022-08-07 DIAGNOSIS — H52222 Regular astigmatism, left eye: Secondary | ICD-10-CM | POA: Diagnosis not present

## 2022-08-07 DIAGNOSIS — H2512 Age-related nuclear cataract, left eye: Secondary | ICD-10-CM | POA: Diagnosis not present

## 2022-08-07 DIAGNOSIS — H5212 Myopia, left eye: Secondary | ICD-10-CM | POA: Diagnosis not present

## 2022-08-21 DIAGNOSIS — H25811 Combined forms of age-related cataract, right eye: Secondary | ICD-10-CM | POA: Diagnosis not present

## 2022-08-21 DIAGNOSIS — H25011 Cortical age-related cataract, right eye: Secondary | ICD-10-CM | POA: Diagnosis not present

## 2022-08-21 DIAGNOSIS — H52221 Regular astigmatism, right eye: Secondary | ICD-10-CM | POA: Diagnosis not present

## 2022-08-21 DIAGNOSIS — H2511 Age-related nuclear cataract, right eye: Secondary | ICD-10-CM | POA: Diagnosis not present

## 2022-08-21 DIAGNOSIS — H269 Unspecified cataract: Secondary | ICD-10-CM | POA: Diagnosis not present

## 2022-10-17 DIAGNOSIS — Z23 Encounter for immunization: Secondary | ICD-10-CM | POA: Diagnosis not present

## 2022-10-20 DIAGNOSIS — Z23 Encounter for immunization: Secondary | ICD-10-CM | POA: Diagnosis not present

## 2022-10-24 DIAGNOSIS — H00012 Hordeolum externum right lower eyelid: Secondary | ICD-10-CM | POA: Diagnosis not present

## 2023-01-05 DIAGNOSIS — I10 Essential (primary) hypertension: Secondary | ICD-10-CM | POA: Diagnosis not present

## 2023-01-05 DIAGNOSIS — Z0000 Encounter for general adult medical examination without abnormal findings: Secondary | ICD-10-CM | POA: Diagnosis not present

## 2023-01-05 DIAGNOSIS — Z23 Encounter for immunization: Secondary | ICD-10-CM | POA: Diagnosis not present

## 2023-01-05 DIAGNOSIS — E559 Vitamin D deficiency, unspecified: Secondary | ICD-10-CM | POA: Diagnosis not present

## 2023-01-05 DIAGNOSIS — R7303 Prediabetes: Secondary | ICD-10-CM | POA: Diagnosis not present

## 2023-01-05 DIAGNOSIS — M810 Age-related osteoporosis without current pathological fracture: Secondary | ICD-10-CM | POA: Diagnosis not present

## 2023-01-05 DIAGNOSIS — K219 Gastro-esophageal reflux disease without esophagitis: Secondary | ICD-10-CM | POA: Diagnosis not present

## 2023-01-22 DIAGNOSIS — M810 Age-related osteoporosis without current pathological fracture: Secondary | ICD-10-CM | POA: Diagnosis not present

## 2023-01-22 DIAGNOSIS — M85852 Other specified disorders of bone density and structure, left thigh: Secondary | ICD-10-CM | POA: Diagnosis not present

## 2023-01-22 DIAGNOSIS — M85851 Other specified disorders of bone density and structure, right thigh: Secondary | ICD-10-CM | POA: Diagnosis not present

## 2023-07-01 ENCOUNTER — Emergency Department (HOSPITAL_COMMUNITY): Payer: Medicare Other

## 2023-07-01 ENCOUNTER — Other Ambulatory Visit: Payer: Self-pay

## 2023-07-01 ENCOUNTER — Emergency Department (HOSPITAL_COMMUNITY)
Admission: EM | Admit: 2023-07-01 | Discharge: 2023-07-01 | Disposition: A | Discharge status: Home / Self Care | Payer: Medicare Other | Attending: Emergency Medicine | Admitting: Emergency Medicine

## 2023-07-01 DIAGNOSIS — Y92019 Unspecified place in single-family (private) house as the place of occurrence of the external cause: Secondary | ICD-10-CM | POA: Insufficient documentation

## 2023-07-01 DIAGNOSIS — R0781 Pleurodynia: Secondary | ICD-10-CM | POA: Insufficient documentation

## 2023-07-01 DIAGNOSIS — Z7982 Long term (current) use of aspirin: Secondary | ICD-10-CM | POA: Insufficient documentation

## 2023-07-01 DIAGNOSIS — S0083XA Contusion of other part of head, initial encounter: Secondary | ICD-10-CM | POA: Insufficient documentation

## 2023-07-01 DIAGNOSIS — W01198A Fall on same level from slipping, tripping and stumbling with subsequent striking against other object, initial encounter: Secondary | ICD-10-CM | POA: Insufficient documentation

## 2023-07-01 DIAGNOSIS — Y93G1 Activity, food preparation and clean up: Secondary | ICD-10-CM | POA: Diagnosis not present

## 2023-07-01 DIAGNOSIS — I10 Essential (primary) hypertension: Secondary | ICD-10-CM | POA: Diagnosis not present

## 2023-07-01 DIAGNOSIS — Z79899 Other long term (current) drug therapy: Secondary | ICD-10-CM | POA: Insufficient documentation

## 2023-07-01 DIAGNOSIS — S0990XA Unspecified injury of head, initial encounter: Secondary | ICD-10-CM | POA: Diagnosis not present

## 2023-07-01 DIAGNOSIS — S0993XA Unspecified injury of face, initial encounter: Secondary | ICD-10-CM | POA: Diagnosis not present

## 2023-07-01 DIAGNOSIS — S199XXA Unspecified injury of neck, initial encounter: Secondary | ICD-10-CM | POA: Diagnosis not present

## 2023-07-01 DIAGNOSIS — G319 Degenerative disease of nervous system, unspecified: Secondary | ICD-10-CM | POA: Diagnosis not present

## 2023-07-01 DIAGNOSIS — W19XXXA Unspecified fall, initial encounter: Secondary | ICD-10-CM

## 2023-07-01 NOTE — ED Provider Notes (Signed)
[place] EMERGENCY DEPARTMENT AT [facility] Provider Note   CSN: [id number] Arrival date & time: 07/01/23  2123     History  Chief Complaint  Patient presents with   Marletta Lor    Marissa Gallegos is a 82 y.o. female.  Patient with past medical history significant for hypertension, GERD, vertigo presents to the emergency department for evaluation secondary to a fall.  Patient states she was trying to open a cake container, pulled too hard and lost her balance when it opened.  She fell onto her left buttock, rolled, and hit her face against the dishwasher.  She denies losing consciousness, denies blood thinner usage.  HPI     Home Medications Prior to Admission medications   Medication Sig Start Date End Date Taking? Authorizing Provider  ALENDRONATE SODIUM PO Take by mouth.    [provider]  amLODipine (NORVASC) 10 MG tablet Take 10 mg by mouth daily.    [provider]  aspirin EC 325 MG EC tablet Take 1 tablet (325 mg total) by mouth 2 (two) times daily after a meal. 04/27/14   Elodia Florence, PA-C  Calcium Carbonate-Vit D-Min (CALTRATE 600+D PLUS PO) Take 1 tablet by mouth 2 (two) times daily.    [provider]  Cholecalciferol (VITAMIN D-3) 1000 UNITS CAPS Take 1,000-2,000 Units by mouth 2 (two) times daily. 2000 units in the morning and 1000 units in the evening    [provider]  clobetasol cream (TEMOVATE) 0.05 % Apply 1 application topically 2 (two) times daily.    [provider]  cloNIDine (CATAPRES) 0.1 MG tablet Take 0.1 mg by mouth 2 (two) times daily.    [provider]  cyclobenzaprine (FLEXERIL) 5 MG tablet Take 1 tablet (5 mg total) by mouth at bedtime as needed for muscle spasms. DO NOT DRINK ALCOHOL OR DRIVE WHILE TAKING THIS MEDICATION 12/05/20   Particia Nearing, PA-C  esomeprazole (NEXIUM) 40 MG capsule Take 40 mg by mouth 2 (two) times daily before a meal.    [provider]  fluticasone  (FLONASE) 50 MCG/ACT nasal spray Place into both nostrils daily.    [provider]  hydrochlorothiazide (HYDRODIURIL) 25 MG tablet Take 25 mg by mouth daily.    [provider]  HYDROcodone-acetaminophen (NORCO/VICODIN) 5-325 MG per tablet Take 1-2 tablets by mouth every 6 (six) hours as needed for moderate pain (breakthrough pain). 04/27/14   Elodia Florence, PA-C  losartan (COZAAR) 100 MG tablet Take 100 mg by mouth daily.    [provider]  Magnesium 250 MG TABS Take 250 mg by mouth 2 (two) times daily.    [provider]  Multiple Vitamins-Minerals (CENTRUM PO) Take 1 tablet by mouth daily.    [provider]  norethindrone-ethinyl estradiol (JINTELI) 1-5 MG-MCG TABS tablet Take 1 tablet by mouth daily.     [provider]      Allergies    Patient has no allergy information on record.    Review of Systems   Review of Systems  Physical Exam Updated Vital Signs BP (!) 154/77 (BP Location: Left Arm)   Pulse 71   Temp 97.9 F (36.6 C) (Oral)   Resp 16   SpO2 100%  Physical Exam Vitals and nursing note reviewed.  HENT:     Head: Normocephalic and atraumatic.  Eyes:     Pupils: Pupils are equal, round, and reactive to light.  Pulmonary:     Effort: Pulmonary effort is normal.  No respiratory distress.  Musculoskeletal:        General: Tenderness (Right sided ribs) present. No signs of injury.     Cervical back: Normal range of motion.  Skin:    General: Skin is dry.     Findings: Bruising (Left face anterior to ear) present.  Neurological:     Mental Status: She is alert.  Psychiatric:        Speech: Speech normal.        Behavior: Behavior normal.     ED Results / Procedures / Treatments   Labs (all labs ordered are listed, but only abnormal results are displayed) Labs Reviewed - No data to display  EKG None  Radiology CT Head Wo Contrast  Result Date: 07/01/2023 CLINICAL DATA:  Trauma. EXAM: CT HEAD WITHOUT  CONTRAST CT MAXILLOFACIAL WITHOUT CONTRAST CT CERVICAL SPINE WITHOUT CONTRAST TECHNIQUE: Multidetector CT imaging of the head, cervical spine, and maxillofacial structures were performed using the standard protocol without intravenous contrast. Multiplanar CT image reconstructions of the cervical spine and maxillofacial structures were also generated. RADIATION DOSE REDUCTION: This exam was performed according to the departmental dose-optimization program which includes automated exposure control, adjustment of the mA and/or kV according to patient size and/or use of iterative reconstruction technique. COMPARISON:  CT dated 01/15/2010. FINDINGS: CT HEAD FINDINGS Brain: Mild age-related atrophy and chronic microvascular ischemic changes. There is no acute intracranial hemorrhage. No mass effect or midline shift. No extra-axial fluid collection. Vascular: No hyperdense vessel or unexpected calcification. Skull: Normal. Negative for fracture or focal lesion. Other: None. CT MAXILLOFACIAL FINDINGS Osseous: No acute fracture.  No mandibular dislocation. Orbits: The globes and retro-orbital fat are preserved. Sinuses: Clear. Soft tissues: None CT CERVICAL SPINE FINDINGS Alignment: No acute subluxation. Skull base and vertebrae: No acute fracture.  Osteopenia. Soft tissues and spinal canal: No prevertebral fluid or swelling. No visible canal hematoma. Disc levels:  No acute findings.  Degenerative changes. Upper chest: No acute findings. Other: None IMPRESSION: 1. No acute intracranial pathology. Mild age-related atrophy and chronic microvascular ischemic changes. 2. No acute/traumatic cervical spine pathology. 3. No acute facial bone fractures. Electronically Signed   By: Elgie Collard M.D.   On: 07/01/2023 22:29   CT Cervical Spine Wo Contrast  Result Date: 07/01/2023 CLINICAL DATA:  Trauma. EXAM: CT HEAD WITHOUT CONTRAST CT MAXILLOFACIAL WITHOUT CONTRAST CT CERVICAL SPINE WITHOUT CONTRAST TECHNIQUE: Multidetector  CT imaging of the head, cervical spine, and maxillofacial structures were performed using the standard protocol without intravenous contrast. Multiplanar CT image reconstructions of the cervical spine and maxillofacial structures were also generated. RADIATION DOSE REDUCTION: This exam was performed according to the departmental dose-optimization program which includes automated exposure control, adjustment of the mA and/or kV according to patient size and/or use of iterative reconstruction technique. COMPARISON:  CT dated 01/15/2010. FINDINGS: CT HEAD FINDINGS Brain: Mild age-related atrophy and chronic microvascular ischemic changes. There is no acute intracranial hemorrhage. No mass effect or midline shift. No extra-axial fluid collection. Vascular: No hyperdense vessel or unexpected calcification. Skull: Normal. Negative for fracture or focal lesion. Other: None. CT MAXILLOFACIAL FINDINGS Osseous: No acute fracture.  No mandibular dislocation. Orbits: The globes and retro-orbital fat are preserved. Sinuses: Clear. Soft tissues: None CT CERVICAL SPINE FINDINGS Alignment: No acute subluxation. Skull base and vertebrae: No acute fracture.  Osteopenia. Soft tissues and spinal canal: No prevertebral fluid or swelling. No visible canal hematoma. Disc levels:  No acute findings.  Degenerative changes. Upper chest: No acute findings. Other:  None IMPRESSION: 1. No acute intracranial pathology. Mild age-related atrophy and chronic microvascular ischemic changes. 2. No acute/traumatic cervical spine pathology. 3. No acute facial bone fractures. Electronically Signed   By: Elgie Collard M.D.   On: 07/01/2023 22:29   CT Maxillofacial WO CM  Result Date: 07/01/2023 CLINICAL DATA:  Trauma. EXAM: CT HEAD WITHOUT CONTRAST CT MAXILLOFACIAL WITHOUT CONTRAST CT CERVICAL SPINE WITHOUT CONTRAST TECHNIQUE: Multidetector CT imaging of the head, cervical spine, and maxillofacial structures were performed using the standard  protocol without intravenous contrast. Multiplanar CT image reconstructions of the cervical spine and maxillofacial structures were also generated. RADIATION DOSE REDUCTION: This exam was performed according to the departmental dose-optimization program which includes automated exposure control, adjustment of the mA and/or kV according to patient size and/or use of iterative reconstruction technique. COMPARISON:  CT dated 01/15/2010. FINDINGS: CT HEAD FINDINGS Brain: Mild age-related atrophy and chronic microvascular ischemic changes. There is no acute intracranial hemorrhage. No mass effect or midline shift. No extra-axial fluid collection. Vascular: No hyperdense vessel or unexpected calcification. Skull: Normal. Negative for fracture or focal lesion. Other: None. CT MAXILLOFACIAL FINDINGS Osseous: No acute fracture.  No mandibular dislocation. Orbits: The globes and retro-orbital fat are preserved. Sinuses: Clear. Soft tissues: None CT CERVICAL SPINE FINDINGS Alignment: No acute subluxation. Skull base and vertebrae: No acute fracture.  Osteopenia. Soft tissues and spinal canal: No prevertebral fluid or swelling. No visible canal hematoma. Disc levels:  No acute findings.  Degenerative changes. Upper chest: No acute findings. Other: None IMPRESSION: 1. No acute intracranial pathology. Mild age-related atrophy and chronic microvascular ischemic changes. 2. No acute/traumatic cervical spine pathology. 3. No acute facial bone fractures. Electronically Signed   By: Elgie Collard M.D.   On: 07/01/2023 22:29   DG Ribs Unilateral W/Chest Right  Result Date: 07/01/2023 CLINICAL DATA:  Fall with rib pain EXAM: RIGHT RIBS AND CHEST - 3+ VIEW COMPARISON:  03/15/2020 FINDINGS: No fracture or other bone lesions are seen involving the ribs. There is no evidence of pneumothorax or pleural effusion. Both lungs are clear. Heart size and mediastinal contours are within normal limits. IMPRESSION: Negative. Electronically  Signed   By: Jasmine Pang M.D.   On: 07/01/2023 22:12    Procedures Procedures    Medications Ordered in ED Medications - No data to display  ED Course/ Medical Decision Making/ A&P                             Medical Decision Making  This patient presents to the ED for concern of injury secondary to a fall, this involves an extensive number of treatment options, and is a complaint that carries with it a high risk of complications and morbidity.  The differential diagnosis includes intracranial abnormality, fracture, dislocation, soft tissue injury, others   Co morbidities that complicate the patient evaluation  Vertigo   Imaging Studies ordered:  I ordered imaging studies including CT head, cervical spine, maxillofacial without contrast.  Right-sided ribs with chest x-ray I independently visualized and interpreted imaging which showed No acute findings I agree with the radiologist interpretation   Test / Admission - Considered:  Patient with some mild bruising but no fracture, dislocation noted on imaging.  Patient states she has been using ice at home.  She states she is able to use ibuprofen at home.  Fall was mechanical in nature.  There is no indication for admission or further emergent workup.  Plan  to discharge at this time with recommendations for Motrin and acetaminophen for pain control.  Patient can continue to use ice as needed.  Return precautions provided.         Final Clinical Impression(s) / ED Diagnoses Final diagnoses:  Fall, initial encounter  Contusion of face, initial encounter    Rx / DC Orders ED Discharge Orders     None         Pamala Duffel 07/01/23 2305    Loetta Rough, MD 07/02/23 [phone]

## 2023-07-01 NOTE — Discharge Instructions (Signed)
You were evaluated today for injuries due to a fall.  Your workup was reassuring with no fractures or acute findings on imaging.  Please continue to use ice packs at home.  You may take Motrin up to 600 mg every 6 hours.  You may also take Tylenol every 6 hours not to exceed 4000 mg in 24 hours.  Please follow-up as needed with your primary care provider.

## 2023-07-01 NOTE — ED Triage Notes (Signed)
Patient is A&Ox4 ambulatory from home. Patient was trying to open a cake container and pulled too hard and fell back. She fell on her left bottom and rolled and hit her face against the dishwasher. No LOC, and no blood thinners.

## 2023-08-19 DIAGNOSIS — E559 Vitamin D deficiency, unspecified: Secondary | ICD-10-CM | POA: Diagnosis not present

## 2023-08-19 DIAGNOSIS — M810 Age-related osteoporosis without current pathological fracture: Secondary | ICD-10-CM | POA: Diagnosis not present

## 2023-08-19 DIAGNOSIS — K219 Gastro-esophageal reflux disease without esophagitis: Secondary | ICD-10-CM | POA: Diagnosis not present

## 2023-08-19 DIAGNOSIS — I10 Essential (primary) hypertension: Secondary | ICD-10-CM | POA: Diagnosis not present

## 2023-08-19 DIAGNOSIS — R7303 Prediabetes: Secondary | ICD-10-CM | POA: Diagnosis not present

## 2023-09-09 DIAGNOSIS — M25511 Pain in right shoulder: Secondary | ICD-10-CM | POA: Diagnosis not present

## 2023-09-09 DIAGNOSIS — Z96659 Presence of unspecified artificial knee joint: Secondary | ICD-10-CM | POA: Diagnosis not present

## 2023-09-22 DIAGNOSIS — R531 Weakness: Secondary | ICD-10-CM | POA: Diagnosis not present

## 2023-09-22 DIAGNOSIS — R262 Difficulty in walking, not elsewhere classified: Secondary | ICD-10-CM | POA: Diagnosis not present

## 2023-09-22 DIAGNOSIS — M25511 Pain in right shoulder: Secondary | ICD-10-CM | POA: Diagnosis not present

## 2023-09-29 DIAGNOSIS — R531 Weakness: Secondary | ICD-10-CM | POA: Diagnosis not present

## 2023-09-29 DIAGNOSIS — M25511 Pain in right shoulder: Secondary | ICD-10-CM | POA: Diagnosis not present

## 2023-09-29 DIAGNOSIS — R262 Difficulty in walking, not elsewhere classified: Secondary | ICD-10-CM | POA: Diagnosis not present

## 2023-10-01 DIAGNOSIS — M25511 Pain in right shoulder: Secondary | ICD-10-CM | POA: Diagnosis not present

## 2023-10-01 DIAGNOSIS — R262 Difficulty in walking, not elsewhere classified: Secondary | ICD-10-CM | POA: Diagnosis not present

## 2023-10-01 DIAGNOSIS — R531 Weakness: Secondary | ICD-10-CM | POA: Diagnosis not present

## 2023-10-02 DIAGNOSIS — Z23 Encounter for immunization: Secondary | ICD-10-CM | POA: Diagnosis not present

## 2023-10-06 DIAGNOSIS — R262 Difficulty in walking, not elsewhere classified: Secondary | ICD-10-CM | POA: Diagnosis not present

## 2023-10-06 DIAGNOSIS — M25511 Pain in right shoulder: Secondary | ICD-10-CM | POA: Diagnosis not present

## 2023-10-06 DIAGNOSIS — R531 Weakness: Secondary | ICD-10-CM | POA: Diagnosis not present

## 2023-10-08 DIAGNOSIS — R262 Difficulty in walking, not elsewhere classified: Secondary | ICD-10-CM | POA: Diagnosis not present

## 2023-10-08 DIAGNOSIS — M25511 Pain in right shoulder: Secondary | ICD-10-CM | POA: Diagnosis not present

## 2023-10-08 DIAGNOSIS — R531 Weakness: Secondary | ICD-10-CM | POA: Diagnosis not present

## 2023-10-09 DIAGNOSIS — Z23 Encounter for immunization: Secondary | ICD-10-CM | POA: Diagnosis not present

## 2023-10-13 DIAGNOSIS — R262 Difficulty in walking, not elsewhere classified: Secondary | ICD-10-CM | POA: Diagnosis not present

## 2023-10-13 DIAGNOSIS — M25511 Pain in right shoulder: Secondary | ICD-10-CM | POA: Diagnosis not present

## 2023-10-13 DIAGNOSIS — R531 Weakness: Secondary | ICD-10-CM | POA: Diagnosis not present

## 2023-10-15 DIAGNOSIS — R262 Difficulty in walking, not elsewhere classified: Secondary | ICD-10-CM | POA: Diagnosis not present

## 2023-10-15 DIAGNOSIS — R531 Weakness: Secondary | ICD-10-CM | POA: Diagnosis not present

## 2023-10-15 DIAGNOSIS — M25511 Pain in right shoulder: Secondary | ICD-10-CM | POA: Diagnosis not present

## 2023-10-20 DIAGNOSIS — R531 Weakness: Secondary | ICD-10-CM | POA: Diagnosis not present

## 2023-10-20 DIAGNOSIS — R262 Difficulty in walking, not elsewhere classified: Secondary | ICD-10-CM | POA: Diagnosis not present

## 2023-10-20 DIAGNOSIS — M25511 Pain in right shoulder: Secondary | ICD-10-CM | POA: Diagnosis not present

## 2023-10-21 DIAGNOSIS — M545 Low back pain, unspecified: Secondary | ICD-10-CM | POA: Diagnosis not present

## 2023-10-21 DIAGNOSIS — M25511 Pain in right shoulder: Secondary | ICD-10-CM | POA: Diagnosis not present

## 2023-10-26 DIAGNOSIS — M25511 Pain in right shoulder: Secondary | ICD-10-CM | POA: Diagnosis not present

## 2023-10-26 DIAGNOSIS — R531 Weakness: Secondary | ICD-10-CM | POA: Diagnosis not present

## 2023-10-26 DIAGNOSIS — R262 Difficulty in walking, not elsewhere classified: Secondary | ICD-10-CM | POA: Diagnosis not present

## 2023-10-28 DIAGNOSIS — M25511 Pain in right shoulder: Secondary | ICD-10-CM | POA: Diagnosis not present

## 2023-10-28 DIAGNOSIS — R531 Weakness: Secondary | ICD-10-CM | POA: Diagnosis not present

## 2023-10-28 DIAGNOSIS — R262 Difficulty in walking, not elsewhere classified: Secondary | ICD-10-CM | POA: Diagnosis not present

## 2023-11-02 DIAGNOSIS — M25511 Pain in right shoulder: Secondary | ICD-10-CM | POA: Diagnosis not present

## 2023-11-02 DIAGNOSIS — R531 Weakness: Secondary | ICD-10-CM | POA: Diagnosis not present

## 2023-11-02 DIAGNOSIS — R262 Difficulty in walking, not elsewhere classified: Secondary | ICD-10-CM | POA: Diagnosis not present

## 2023-11-04 DIAGNOSIS — R531 Weakness: Secondary | ICD-10-CM | POA: Diagnosis not present

## 2023-11-04 DIAGNOSIS — R262 Difficulty in walking, not elsewhere classified: Secondary | ICD-10-CM | POA: Diagnosis not present

## 2023-11-04 DIAGNOSIS — M25511 Pain in right shoulder: Secondary | ICD-10-CM | POA: Diagnosis not present

## 2023-11-06 DIAGNOSIS — H35033 Hypertensive retinopathy, bilateral: Secondary | ICD-10-CM | POA: Diagnosis not present

## 2023-11-06 DIAGNOSIS — H04123 Dry eye syndrome of bilateral lacrimal glands: Secondary | ICD-10-CM | POA: Diagnosis not present

## 2023-11-06 DIAGNOSIS — H524 Presbyopia: Secondary | ICD-10-CM | POA: Diagnosis not present

## 2023-11-06 DIAGNOSIS — H5202 Hypermetropia, left eye: Secondary | ICD-10-CM | POA: Diagnosis not present

## 2023-11-06 DIAGNOSIS — H353132 Nonexudative age-related macular degeneration, bilateral, intermediate dry stage: Secondary | ICD-10-CM | POA: Diagnosis not present

## 2023-11-18 DIAGNOSIS — M25511 Pain in right shoulder: Secondary | ICD-10-CM | POA: Diagnosis not present

## 2023-11-18 DIAGNOSIS — R531 Weakness: Secondary | ICD-10-CM | POA: Diagnosis not present

## 2023-11-18 DIAGNOSIS — R262 Difficulty in walking, not elsewhere classified: Secondary | ICD-10-CM | POA: Diagnosis not present

## 2023-12-03 DIAGNOSIS — R531 Weakness: Secondary | ICD-10-CM | POA: Diagnosis not present

## 2023-12-03 DIAGNOSIS — M25511 Pain in right shoulder: Secondary | ICD-10-CM | POA: Diagnosis not present

## 2023-12-03 DIAGNOSIS — R262 Difficulty in walking, not elsewhere classified: Secondary | ICD-10-CM | POA: Diagnosis not present

## 2023-12-04 DIAGNOSIS — R531 Weakness: Secondary | ICD-10-CM | POA: Diagnosis not present

## 2023-12-04 DIAGNOSIS — M25511 Pain in right shoulder: Secondary | ICD-10-CM | POA: Diagnosis not present

## 2023-12-04 DIAGNOSIS — R262 Difficulty in walking, not elsewhere classified: Secondary | ICD-10-CM | POA: Diagnosis not present

## 2023-12-07 DIAGNOSIS — M79604 Pain in right leg: Secondary | ICD-10-CM | POA: Diagnosis not present

## 2023-12-07 DIAGNOSIS — M545 Low back pain, unspecified: Secondary | ICD-10-CM | POA: Diagnosis not present

## 2023-12-14 DIAGNOSIS — M25511 Pain in right shoulder: Secondary | ICD-10-CM | POA: Diagnosis not present

## 2023-12-14 DIAGNOSIS — R262 Difficulty in walking, not elsewhere classified: Secondary | ICD-10-CM | POA: Diagnosis not present

## 2023-12-14 DIAGNOSIS — R531 Weakness: Secondary | ICD-10-CM | POA: Diagnosis not present

## 2023-12-16 DIAGNOSIS — R262 Difficulty in walking, not elsewhere classified: Secondary | ICD-10-CM | POA: Diagnosis not present

## 2023-12-16 DIAGNOSIS — M25511 Pain in right shoulder: Secondary | ICD-10-CM | POA: Diagnosis not present

## 2023-12-16 DIAGNOSIS — R531 Weakness: Secondary | ICD-10-CM | POA: Diagnosis not present

## 2023-12-29 DIAGNOSIS — M25511 Pain in right shoulder: Secondary | ICD-10-CM | POA: Diagnosis not present

## 2023-12-29 DIAGNOSIS — R262 Difficulty in walking, not elsewhere classified: Secondary | ICD-10-CM | POA: Diagnosis not present

## 2023-12-29 DIAGNOSIS — R531 Weakness: Secondary | ICD-10-CM | POA: Diagnosis not present

## 2023-12-31 DIAGNOSIS — M25511 Pain in right shoulder: Secondary | ICD-10-CM | POA: Diagnosis not present

## 2023-12-31 DIAGNOSIS — R262 Difficulty in walking, not elsewhere classified: Secondary | ICD-10-CM | POA: Diagnosis not present

## 2023-12-31 DIAGNOSIS — R531 Weakness: Secondary | ICD-10-CM | POA: Diagnosis not present

## 2024-01-05 DIAGNOSIS — R531 Weakness: Secondary | ICD-10-CM | POA: Diagnosis not present

## 2024-01-05 DIAGNOSIS — R262 Difficulty in walking, not elsewhere classified: Secondary | ICD-10-CM | POA: Diagnosis not present

## 2024-01-05 DIAGNOSIS — M25511 Pain in right shoulder: Secondary | ICD-10-CM | POA: Diagnosis not present

## 2024-01-07 DIAGNOSIS — M25511 Pain in right shoulder: Secondary | ICD-10-CM | POA: Diagnosis not present

## 2024-01-07 DIAGNOSIS — R262 Difficulty in walking, not elsewhere classified: Secondary | ICD-10-CM | POA: Diagnosis not present

## 2024-01-07 DIAGNOSIS — R531 Weakness: Secondary | ICD-10-CM | POA: Diagnosis not present

## 2024-01-11 ENCOUNTER — Other Ambulatory Visit: Payer: Self-pay | Admitting: Orthopaedic Surgery

## 2024-01-11 DIAGNOSIS — M79604 Pain in right leg: Secondary | ICD-10-CM | POA: Diagnosis not present

## 2024-01-11 DIAGNOSIS — M545 Low back pain, unspecified: Secondary | ICD-10-CM

## 2024-01-11 DIAGNOSIS — M25511 Pain in right shoulder: Secondary | ICD-10-CM | POA: Diagnosis not present

## 2024-01-11 DIAGNOSIS — R531 Weakness: Secondary | ICD-10-CM | POA: Diagnosis not present

## 2024-01-11 DIAGNOSIS — R262 Difficulty in walking, not elsewhere classified: Secondary | ICD-10-CM | POA: Diagnosis not present

## 2024-01-13 DIAGNOSIS — R262 Difficulty in walking, not elsewhere classified: Secondary | ICD-10-CM | POA: Diagnosis not present

## 2024-01-13 DIAGNOSIS — M25511 Pain in right shoulder: Secondary | ICD-10-CM | POA: Diagnosis not present

## 2024-01-13 DIAGNOSIS — R531 Weakness: Secondary | ICD-10-CM | POA: Diagnosis not present

## 2024-01-15 DIAGNOSIS — R7303 Prediabetes: Secondary | ICD-10-CM | POA: Diagnosis not present

## 2024-01-15 DIAGNOSIS — U099 Post covid-19 condition, unspecified: Secondary | ICD-10-CM | POA: Diagnosis not present

## 2024-01-15 DIAGNOSIS — K219 Gastro-esophageal reflux disease without esophagitis: Secondary | ICD-10-CM | POA: Diagnosis not present

## 2024-01-15 DIAGNOSIS — Z0000 Encounter for general adult medical examination without abnormal findings: Secondary | ICD-10-CM | POA: Diagnosis not present

## 2024-01-15 DIAGNOSIS — M810 Age-related osteoporosis without current pathological fracture: Secondary | ICD-10-CM | POA: Diagnosis not present

## 2024-01-15 DIAGNOSIS — M791 Myalgia, unspecified site: Secondary | ICD-10-CM | POA: Diagnosis not present

## 2024-01-15 DIAGNOSIS — I10 Essential (primary) hypertension: Secondary | ICD-10-CM | POA: Diagnosis not present

## 2024-01-15 DIAGNOSIS — E559 Vitamin D deficiency, unspecified: Secondary | ICD-10-CM | POA: Diagnosis not present

## 2024-01-19 DIAGNOSIS — R531 Weakness: Secondary | ICD-10-CM | POA: Diagnosis not present

## 2024-01-19 DIAGNOSIS — M25511 Pain in right shoulder: Secondary | ICD-10-CM | POA: Diagnosis not present

## 2024-01-19 DIAGNOSIS — R262 Difficulty in walking, not elsewhere classified: Secondary | ICD-10-CM | POA: Diagnosis not present

## 2024-01-22 DIAGNOSIS — R262 Difficulty in walking, not elsewhere classified: Secondary | ICD-10-CM | POA: Diagnosis not present

## 2024-01-22 DIAGNOSIS — M25511 Pain in right shoulder: Secondary | ICD-10-CM | POA: Diagnosis not present

## 2024-01-22 DIAGNOSIS — R531 Weakness: Secondary | ICD-10-CM | POA: Diagnosis not present

## 2024-01-25 DIAGNOSIS — R531 Weakness: Secondary | ICD-10-CM | POA: Diagnosis not present

## 2024-01-25 DIAGNOSIS — R262 Difficulty in walking, not elsewhere classified: Secondary | ICD-10-CM | POA: Diagnosis not present

## 2024-01-25 DIAGNOSIS — M25511 Pain in right shoulder: Secondary | ICD-10-CM | POA: Diagnosis not present

## 2024-02-01 DIAGNOSIS — R531 Weakness: Secondary | ICD-10-CM | POA: Diagnosis not present

## 2024-02-01 DIAGNOSIS — M25511 Pain in right shoulder: Secondary | ICD-10-CM | POA: Diagnosis not present

## 2024-02-01 DIAGNOSIS — R262 Difficulty in walking, not elsewhere classified: Secondary | ICD-10-CM | POA: Diagnosis not present

## 2024-02-03 DIAGNOSIS — M25511 Pain in right shoulder: Secondary | ICD-10-CM | POA: Diagnosis not present

## 2024-02-03 DIAGNOSIS — R531 Weakness: Secondary | ICD-10-CM | POA: Diagnosis not present

## 2024-02-03 DIAGNOSIS — R262 Difficulty in walking, not elsewhere classified: Secondary | ICD-10-CM | POA: Diagnosis not present

## 2024-02-10 ENCOUNTER — Ambulatory Visit (HOSPITAL_COMMUNITY)
Admission: RE | Admit: 2024-02-10 | Discharge: 2024-02-10 | Disposition: A | Discharge status: Home / Self Care | Payer: Medicare Other | Source: Ambulatory Visit | Attending: Physician Assistant | Admitting: Physician Assistant

## 2024-02-10 ENCOUNTER — Encounter (HOSPITAL_COMMUNITY): Payer: Self-pay

## 2024-02-10 VITALS — BP 155/81 | HR 54 | Temp 98.0°F | Resp 18

## 2024-02-10 DIAGNOSIS — J101 Influenza due to other identified influenza virus with other respiratory manifestations: Secondary | ICD-10-CM

## 2024-02-10 LAB — POC COVID19/FLU A&B COMBO
Covid Antigen, POC: NEGATIVE
Influenza A Antigen, POC: POSITIVE — AB
Influenza B Antigen, POC: NEGATIVE

## 2024-02-10 LAB — POC RSV: RSV Antigen, POC: NEGATIVE

## 2024-02-10 NOTE — ED Triage Notes (Signed)
Pt states her husband being treated for "flu".   She states her sx started Monday cough, congestion, body aches. And headache. She isnt taking any meds for her sx due to hypertension she isnt sure what to take. She is on steroids from another physician.

## 2024-02-10 NOTE — ED Provider Notes (Signed)
[facility]    CSN: [id number] Arrival date & time: 02/10/24  1218      History   Chief Complaint Chief Complaint  Patient presents with   Cough   Nasal Congestion   Generalized Body Aches   Headache    HPI Marissa Gallegos is a 83 y.o. female.   HPI  She reports she is having coughing, body aches, facial soreness, runny nose She states her husband tested positive for the flu on Monday  She states she started to have symptoms on Monday as well Interventions: nothing so far. 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      Home Medications    Prior to Admission medications   Medication Sig Start Date End Date Taking? Authorizing Provider  ALENDRONATE SODIUM PO Take by mouth.   Yes [provider]  amLODipine (NORVASC) 10 MG tablet Take 10 mg by mouth daily.   Yes [provider]  bisoprolol (ZEBETA) 5 MG tablet Take 5 mg by mouth daily.   Yes [provider]  Calcium Carbonate-Vit D-Min (CALTRATE 600+D PLUS PO) Take 1 tablet by mouth 2 (two) times daily.   Yes [provider]  Cholecalciferol (VITAMIN D-3) 1000 UNITS CAPS Take 1,000-2,000 Units by mouth 2 (two) times daily. 2000 units in the morning and 1000 units in the evening   Yes [provider]  cloNIDine (CATAPRES) 0.1 MG tablet Take 0.1 mg by mouth  2 (two) times daily.   Yes [provider]  esomeprazole (NEXIUM) 40 MG capsule Take 40 mg by mouth 2 (two) times daily before a meal.   Yes [provider]  hydrochlorothiazide (HYDRODIURIL) 25 MG tablet Take 25 mg by mouth daily.   Yes [provider]  losartan (COZAAR) 100 MG tablet Take 100 mg by mouth daily.   Yes [provider]  Magnesium 250 MG TABS Take 250 mg by mouth 2 (two) times daily.   Yes [provider]  Multiple Vitamins-Minerals (CENTRUM PO) Take 1 tablet by mouth daily.   Yes [provider]  norethindrone-ethinyl estradiol (JINTELI) 1-5 MG-MCG TABS tablet Take 1 tablet by mouth daily.    Yes [provider]  predniSONE (STERAPRED UNI-PAK 21 TAB) 5 MG (21) TBPK tablet SMARTSIG:- Tablet(s) By Mouth - 01/11/24  Yes [provider]  aspirin EC 325 MG EC tablet Take 1 tablet (325 mg total) by mouth 2 (two) times daily after a meal. 04/27/14   Elodia Florence, PA-C  clobetasol cream (TEMOVATE) 0.05 % Apply 1 application topically 2 (two) times daily.    [provider]  cyclobenzaprine (FLEXERIL) 5 MG tablet Take 1 tablet (5 mg total) by mouth at bedtime as needed for muscle spasms. DO NOT DRINK ALCOHOL OR DRIVE WHILE  TAKING THIS MEDICATION 12/05/20   Particia Nearing, PA-C  fluticasone [facility]) 50 MCG/ACT nasal spray Place into both nostrils daily.    [provider]  HYDROcodone-acetaminophen (NORCO/VICODIN) 5-325 MG per tablet Take 1-2 tablets by mouth every 6 (six) hours as needed for moderate pain (breakthrough pain). 04/27/14   Elodia Florence, PA-C    Family History History reviewed. No pertinent family history.  Social History Social History   Tobacco Use   Smoking status: Never   Smokeless tobacco: Never  Vaping Use   Vaping status: Never Used  Substance Use Topics   Alcohol use: Yes    Comment: occ   Drug use: Not Currently     Allergies   Patient has no known  allergies.   Review of Systems Review of Systems  Constitutional:  Positive for fatigue. Negative for chills and fever.  HENT:  Positive for rhinorrhea, sinus pressure and sore throat. Negative for congestion and ear pain.   Respiratory:  Positive for cough. Negative for shortness of breath and wheezing.   Gastrointestinal:  Negative for diarrhea, nausea and vomiting.  Musculoskeletal:  Positive for myalgias.  Neurological:  Positive for headaches. Negative for dizziness and light-headedness.     Physical Exam Triage Vital Signs ED Triage Vitals  Encounter Vitals Group     BP 02/10/24 1306 (!) 155/81     Systolic BP Percentile --      Diastolic BP Percentile --      Pulse Rate 02/10/24 1306 (!) 54     Resp 02/10/24 1306 18     Temp 02/10/24 1306 98 F (36.7 C)     Temp Source 02/10/24 1306 Oral     SpO2 02/10/24 1306 95 %     Weight --      Height --      Head Circumference --      Peak Flow --      Pain Score 02/10/24 1304 0     Pain Loc --      Pain Education --      Exclude from Growth Chart --    No data found.  Updated Vital Signs BP (!) 155/81 (BP Location: Right Arm)   Pulse (!) 54   Temp 98 F (36.7 C) (Oral)   Resp 18   SpO2 95%   Visual Acuity Right Eye Distance:   Left Eye Distance:   Bilateral Distance:    Right Eye Near:   Left Eye Near:    Bilateral Near:     Physical Exam Vitals reviewed.  Constitutional:      General: She is awake.     Appearance: Normal appearance. She is well-developed and well-groomed.  HENT:     Head: Normocephalic and atraumatic.     Right Ear: Hearing, tympanic membrane and ear canal normal.     Left Ear: Hearing, tympanic membrane and ear canal normal.     Mouth/Throat:     Lips: Pink.     Mouth: Mucous membranes are moist.     Pharynx: Oropharynx is clear. Uvula midline. No pharyngeal swelling, oropharyngeal exudate, posterior oropharyngeal erythema, uvula swelling or postnasal drip.  Cardiovascular:      Rate and Rhythm: Normal rate and regular rhythm.     Pulses: Normal pulses.          Radial pulses are 2+ on the right side and 2+ on the left side.     Heart sounds: Normal heart sounds. No murmur heard.  No friction rub. No gallop.  Pulmonary:     Effort: Pulmonary effort is normal.     Breath sounds: Normal breath sounds. No decreased air movement. No decreased breath sounds, wheezing, rhonchi or rales.  Musculoskeletal:     Cervical back: Normal range of motion and neck supple.  Lymphadenopathy:     Head:     Right side of head: No submental, submandibular or preauricular adenopathy.     Left side of head: No submental, submandibular or preauricular adenopathy.     Cervical:     Right cervical: No superficial cervical adenopathy.    Left cervical: No superficial cervical adenopathy.     Upper Body:     Right upper body: No supraclavicular adenopathy.     Left upper body: No supraclavicular adenopathy.  Skin:    General: Skin is warm and dry.  Neurological:     Mental Status: She is alert and oriented to person, place, and time.     GCS: GCS eye subscore is 4. GCS verbal subscore is 5. GCS motor subscore is 6.  Psychiatric:        Mood and Affect: Mood normal.        Speech: Speech normal.        Behavior: Behavior normal. Behavior is cooperative.      UC Treatments / Results  Labs (all labs ordered are listed, but only abnormal results are displayed) Labs Reviewed  POC COVID19/FLU A&B COMBO - Abnormal; Notable for the following components:      Result Value   Influenza A Antigen, POC Positive (*)    All other components within normal limits  POC RSV    EKG   Radiology No results found.  Procedures Procedures (including critical care time)  Medications Ordered in UC Medications - No data to display  Initial Impression / Assessment and Plan / UC Course  I have reviewed the triage vital signs and the nursing notes.  Pertinent labs & imaging results that  were available during my care of the patient were reviewed by me and considered in my medical decision making (see chart for details).      Final Clinical Impressions(s) / UC Diagnoses   Final diagnoses:  Influenza A   Acute, new concerns Patient reports that she has had cough, congestion, body aches since Monday.  She has not been taking any medications for this.  Rapid flu testing was positive for flu A.  At this time she is outside the window for Tamiflu administration.  Recommend over-the-counter medications such as Robitussin, Mucinex, Tylenol as needed for symptomatic management.  ED and return precautions reviewed and provided in after visit summary.  Follow-up as needed for progressing or persistent symptoms    Discharge Instructions      You tested positive for Flu A.  At this time you are outside of the window for Tamiflu administration.  But we can do a few other things to help make you more comfortable while your body is fighting the infection off  Symptoms can last for 3-10 days with lingering cough and intermittent symptoms lasting weeks after that.  The goal of treatment at this time is to reduce your symptoms and discomfort   I recommend using Robitussin and Mucinex (regular formulations, nothing with decongestants or DM)  You can also use Tylenol for body aches and fever reduction I also recommend adding an antihistamine to your daily regimen This includes medications like Claritin, Allegra, Zyrtec- the generics of these  work very well and are usually less expensive I recommend using Flonase nasal spray - 2 puffs twice per day to help with your nasal congestion The antihistamines and Flonase can take a few weeks to provide significant relief from allergy symptoms but should start to provide some benefit soon. You can use a humidifier at night to help with preventing nasal dryness and irritation   If your symptoms do not improve or become worse in the next 5-7 days  please make an apt at the office so we can see you  Go to the ER if you begin to have more serious symptoms such as shortness of breath, trouble breathing, loss of consciousness, swelling around the eyes, high fever, severe lasting headaches, vision changes or neck pain/stiffness.       ED Prescriptions   None    PDMP not reviewed this encounter.   Roselind Messier 02/10/24 2037

## 2024-02-10 NOTE — Discharge Instructions (Addendum)
You tested positive for Flu A.  At this time you are outside of the window for Tamiflu administration.  But we can do a few other things to help make you more comfortable while your body is fighting the infection off  Symptoms can last for 3-10 days with lingering cough and intermittent symptoms lasting weeks after that.  The goal of treatment at this time is to reduce your symptoms and discomfort   I recommend using Robitussin and Mucinex (regular formulations, nothing with decongestants or DM)  You can also use Tylenol for body aches and fever reduction I also recommend adding an antihistamine to your daily regimen This includes medications like Claritin, Allegra, Zyrtec- the generics of these work very well and are usually less expensive I recommend using Flonase nasal spray - 2 puffs twice per day to help with your nasal congestion The antihistamines and Flonase can take a few weeks to provide significant relief from allergy symptoms but should start to provide some benefit soon. You can use a humidifier at night to help with preventing nasal dryness and irritation   If your symptoms do not improve or become worse in the next 5-7 days please make an apt at the office so we can see you  Go to the ER if you begin to have more serious symptoms such as shortness of breath, trouble breathing, loss of consciousness, swelling around the eyes, high fever, severe lasting headaches, vision changes or neck pain/stiffness.

## 2024-02-12 ENCOUNTER — Other Ambulatory Visit: Payer: Medicare Other

## 2024-03-08 DIAGNOSIS — I10 Essential (primary) hypertension: Secondary | ICD-10-CM | POA: Diagnosis not present

## 2024-03-09 DIAGNOSIS — Z1231 Encounter for screening mammogram for malignant neoplasm of breast: Secondary | ICD-10-CM | POA: Diagnosis not present

## 2024-03-09 DIAGNOSIS — Z124 Encounter for screening for malignant neoplasm of cervix: Secondary | ICD-10-CM | POA: Diagnosis not present

## 2024-03-09 DIAGNOSIS — Z01419 Encounter for gynecological examination (general) (routine) without abnormal findings: Secondary | ICD-10-CM | POA: Diagnosis not present

## 2024-03-09 DIAGNOSIS — Z1331 Encounter for screening for depression: Secondary | ICD-10-CM | POA: Diagnosis not present

## 2024-03-09 DIAGNOSIS — Z01411 Encounter for gynecological examination (general) (routine) with abnormal findings: Secondary | ICD-10-CM | POA: Diagnosis not present

## 2024-03-16 ENCOUNTER — Other Ambulatory Visit: Payer: Self-pay | Admitting: Orthopaedic Surgery

## 2024-03-16 DIAGNOSIS — M545 Low back pain, unspecified: Secondary | ICD-10-CM

## 2024-03-17 DIAGNOSIS — E559 Vitamin D deficiency, unspecified: Secondary | ICD-10-CM | POA: Diagnosis not present

## 2024-03-17 DIAGNOSIS — R7303 Prediabetes: Secondary | ICD-10-CM | POA: Diagnosis not present

## 2024-03-17 DIAGNOSIS — E210 Primary hyperparathyroidism: Secondary | ICD-10-CM | POA: Diagnosis not present

## 2024-03-26 ENCOUNTER — Ambulatory Visit
Admission: RE | Admit: 2024-03-26 | Discharge: 2024-03-26 | Disposition: A | Discharge status: Home / Self Care | Source: Ambulatory Visit | Attending: Orthopaedic Surgery | Admitting: Orthopaedic Surgery

## 2024-03-26 DIAGNOSIS — M5126 Other intervertebral disc displacement, lumbar region: Secondary | ICD-10-CM | POA: Diagnosis not present

## 2024-03-26 DIAGNOSIS — M48061 Spinal stenosis, lumbar region without neurogenic claudication: Secondary | ICD-10-CM | POA: Diagnosis not present

## 2024-03-26 DIAGNOSIS — M545 Low back pain, unspecified: Secondary | ICD-10-CM

## 2024-03-28 DIAGNOSIS — I10 Essential (primary) hypertension: Secondary | ICD-10-CM | POA: Diagnosis not present

## 2024-03-28 DIAGNOSIS — M810 Age-related osteoporosis without current pathological fracture: Secondary | ICD-10-CM | POA: Diagnosis not present

## 2024-04-06 DIAGNOSIS — I10 Essential (primary) hypertension: Secondary | ICD-10-CM | POA: Diagnosis not present

## 2024-04-26 DIAGNOSIS — M48062 Spinal stenosis, lumbar region with neurogenic claudication: Secondary | ICD-10-CM | POA: Diagnosis not present

## 2024-04-27 DIAGNOSIS — M810 Age-related osteoporosis without current pathological fracture: Secondary | ICD-10-CM | POA: Diagnosis not present

## 2024-04-27 DIAGNOSIS — I10 Essential (primary) hypertension: Secondary | ICD-10-CM | POA: Diagnosis not present

## 2024-05-06 DIAGNOSIS — I10 Essential (primary) hypertension: Secondary | ICD-10-CM | POA: Diagnosis not present

## 2024-05-10 DIAGNOSIS — M48062 Spinal stenosis, lumbar region with neurogenic claudication: Secondary | ICD-10-CM | POA: Diagnosis not present

## 2024-05-28 DIAGNOSIS — I10 Essential (primary) hypertension: Secondary | ICD-10-CM | POA: Diagnosis not present

## 2024-05-28 DIAGNOSIS — M810 Age-related osteoporosis without current pathological fracture: Secondary | ICD-10-CM | POA: Diagnosis not present

## 2024-06-05 DIAGNOSIS — I10 Essential (primary) hypertension: Secondary | ICD-10-CM | POA: Diagnosis not present

## 2024-06-13 DIAGNOSIS — M48062 Spinal stenosis, lumbar region with neurogenic claudication: Secondary | ICD-10-CM | POA: Diagnosis not present

## 2024-06-15 DIAGNOSIS — R531 Weakness: Secondary | ICD-10-CM | POA: Diagnosis not present

## 2024-06-15 DIAGNOSIS — R262 Difficulty in walking, not elsewhere classified: Secondary | ICD-10-CM | POA: Diagnosis not present

## 2024-06-20 DIAGNOSIS — R262 Difficulty in walking, not elsewhere classified: Secondary | ICD-10-CM | POA: Diagnosis not present

## 2024-06-20 DIAGNOSIS — R531 Weakness: Secondary | ICD-10-CM | POA: Diagnosis not present

## 2024-06-24 DIAGNOSIS — R531 Weakness: Secondary | ICD-10-CM | POA: Diagnosis not present

## 2024-06-24 DIAGNOSIS — R262 Difficulty in walking, not elsewhere classified: Secondary | ICD-10-CM | POA: Diagnosis not present

## 2024-06-27 DIAGNOSIS — R531 Weakness: Secondary | ICD-10-CM | POA: Diagnosis not present

## 2024-06-27 DIAGNOSIS — R262 Difficulty in walking, not elsewhere classified: Secondary | ICD-10-CM | POA: Diagnosis not present

## 2024-06-27 DIAGNOSIS — M810 Age-related osteoporosis without current pathological fracture: Secondary | ICD-10-CM | POA: Diagnosis not present

## 2024-06-27 DIAGNOSIS — I10 Essential (primary) hypertension: Secondary | ICD-10-CM | POA: Diagnosis not present

## 2024-06-30 DIAGNOSIS — R262 Difficulty in walking, not elsewhere classified: Secondary | ICD-10-CM | POA: Diagnosis not present

## 2024-06-30 DIAGNOSIS — R531 Weakness: Secondary | ICD-10-CM | POA: Diagnosis not present

## 2024-07-04 DIAGNOSIS — R262 Difficulty in walking, not elsewhere classified: Secondary | ICD-10-CM | POA: Diagnosis not present

## 2024-07-04 DIAGNOSIS — R531 Weakness: Secondary | ICD-10-CM | POA: Diagnosis not present

## 2024-07-05 DIAGNOSIS — I10 Essential (primary) hypertension: Secondary | ICD-10-CM | POA: Diagnosis not present

## 2024-07-06 DIAGNOSIS — R262 Difficulty in walking, not elsewhere classified: Secondary | ICD-10-CM | POA: Diagnosis not present

## 2024-07-06 DIAGNOSIS — R531 Weakness: Secondary | ICD-10-CM | POA: Diagnosis not present

## 2024-07-28 DIAGNOSIS — M810 Age-related osteoporosis without current pathological fracture: Secondary | ICD-10-CM | POA: Diagnosis not present

## 2024-07-28 DIAGNOSIS — I10 Essential (primary) hypertension: Secondary | ICD-10-CM | POA: Diagnosis not present

## 2024-08-02 DIAGNOSIS — R531 Weakness: Secondary | ICD-10-CM | POA: Diagnosis not present

## 2024-08-02 DIAGNOSIS — R262 Difficulty in walking, not elsewhere classified: Secondary | ICD-10-CM | POA: Diagnosis not present

## 2024-08-04 DIAGNOSIS — R262 Difficulty in walking, not elsewhere classified: Secondary | ICD-10-CM | POA: Diagnosis not present

## 2024-08-04 DIAGNOSIS — I10 Essential (primary) hypertension: Secondary | ICD-10-CM | POA: Diagnosis not present

## 2024-08-04 DIAGNOSIS — R531 Weakness: Secondary | ICD-10-CM | POA: Diagnosis not present

## 2024-08-09 DIAGNOSIS — E559 Vitamin D deficiency, unspecified: Secondary | ICD-10-CM | POA: Diagnosis not present

## 2024-08-09 DIAGNOSIS — K219 Gastro-esophageal reflux disease without esophagitis: Secondary | ICD-10-CM | POA: Diagnosis not present

## 2024-08-09 DIAGNOSIS — I10 Essential (primary) hypertension: Secondary | ICD-10-CM | POA: Diagnosis not present

## 2024-08-09 DIAGNOSIS — M810 Age-related osteoporosis without current pathological fracture: Secondary | ICD-10-CM | POA: Diagnosis not present

## 2024-08-09 DIAGNOSIS — G4700 Insomnia, unspecified: Secondary | ICD-10-CM | POA: Diagnosis not present

## 2024-08-10 DIAGNOSIS — R531 Weakness: Secondary | ICD-10-CM | POA: Diagnosis not present

## 2024-08-10 DIAGNOSIS — R262 Difficulty in walking, not elsewhere classified: Secondary | ICD-10-CM | POA: Diagnosis not present

## 2024-08-15 DIAGNOSIS — R531 Weakness: Secondary | ICD-10-CM | POA: Diagnosis not present

## 2024-08-15 DIAGNOSIS — R262 Difficulty in walking, not elsewhere classified: Secondary | ICD-10-CM | POA: Diagnosis not present

## 2024-08-18 DIAGNOSIS — R262 Difficulty in walking, not elsewhere classified: Secondary | ICD-10-CM | POA: Diagnosis not present

## 2024-08-18 DIAGNOSIS — R531 Weakness: Secondary | ICD-10-CM | POA: Diagnosis not present

## 2024-08-25 DIAGNOSIS — R262 Difficulty in walking, not elsewhere classified: Secondary | ICD-10-CM | POA: Diagnosis not present

## 2024-08-25 DIAGNOSIS — R531 Weakness: Secondary | ICD-10-CM | POA: Diagnosis not present

## 2024-08-26 DIAGNOSIS — R531 Weakness: Secondary | ICD-10-CM | POA: Diagnosis not present

## 2024-08-26 DIAGNOSIS — R262 Difficulty in walking, not elsewhere classified: Secondary | ICD-10-CM | POA: Diagnosis not present

## 2024-08-28 DIAGNOSIS — M810 Age-related osteoporosis without current pathological fracture: Secondary | ICD-10-CM | POA: Diagnosis not present

## 2024-08-28 DIAGNOSIS — I10 Essential (primary) hypertension: Secondary | ICD-10-CM | POA: Diagnosis not present

## 2024-08-31 DIAGNOSIS — R531 Weakness: Secondary | ICD-10-CM | POA: Diagnosis not present

## 2024-08-31 DIAGNOSIS — R262 Difficulty in walking, not elsewhere classified: Secondary | ICD-10-CM | POA: Diagnosis not present

## 2024-09-02 DIAGNOSIS — R262 Difficulty in walking, not elsewhere classified: Secondary | ICD-10-CM | POA: Diagnosis not present

## 2024-09-02 DIAGNOSIS — R531 Weakness: Secondary | ICD-10-CM | POA: Diagnosis not present

## 2024-09-03 DIAGNOSIS — I10 Essential (primary) hypertension: Secondary | ICD-10-CM | POA: Diagnosis not present

## 2024-09-05 DIAGNOSIS — R531 Weakness: Secondary | ICD-10-CM | POA: Diagnosis not present

## 2024-09-05 DIAGNOSIS — R262 Difficulty in walking, not elsewhere classified: Secondary | ICD-10-CM | POA: Diagnosis not present

## 2024-09-13 DIAGNOSIS — R262 Difficulty in walking, not elsewhere classified: Secondary | ICD-10-CM | POA: Diagnosis not present

## 2024-09-13 DIAGNOSIS — R531 Weakness: Secondary | ICD-10-CM | POA: Diagnosis not present

## 2024-09-15 DIAGNOSIS — R531 Weakness: Secondary | ICD-10-CM | POA: Diagnosis not present

## 2024-09-15 DIAGNOSIS — R262 Difficulty in walking, not elsewhere classified: Secondary | ICD-10-CM | POA: Diagnosis not present

## 2024-09-16 DIAGNOSIS — Z23 Encounter for immunization: Secondary | ICD-10-CM | POA: Diagnosis not present

## 2024-09-19 DIAGNOSIS — R531 Weakness: Secondary | ICD-10-CM | POA: Diagnosis not present

## 2024-09-19 DIAGNOSIS — R262 Difficulty in walking, not elsewhere classified: Secondary | ICD-10-CM | POA: Diagnosis not present

## 2024-09-22 DIAGNOSIS — R262 Difficulty in walking, not elsewhere classified: Secondary | ICD-10-CM | POA: Diagnosis not present

## 2024-09-22 DIAGNOSIS — R531 Weakness: Secondary | ICD-10-CM | POA: Diagnosis not present

## 2024-09-23 DIAGNOSIS — Z23 Encounter for immunization: Secondary | ICD-10-CM | POA: Diagnosis not present

## 2024-09-26 DIAGNOSIS — M47816 Spondylosis without myelopathy or radiculopathy, lumbar region: Secondary | ICD-10-CM | POA: Diagnosis not present

## 2024-09-27 DIAGNOSIS — M810 Age-related osteoporosis without current pathological fracture: Secondary | ICD-10-CM | POA: Diagnosis not present

## 2024-09-27 DIAGNOSIS — I10 Essential (primary) hypertension: Secondary | ICD-10-CM | POA: Diagnosis not present

## 2024-09-29 DIAGNOSIS — R262 Difficulty in walking, not elsewhere classified: Secondary | ICD-10-CM | POA: Diagnosis not present

## 2024-09-29 DIAGNOSIS — R531 Weakness: Secondary | ICD-10-CM | POA: Diagnosis not present

## 2024-10-03 DIAGNOSIS — R262 Difficulty in walking, not elsewhere classified: Secondary | ICD-10-CM | POA: Diagnosis not present

## 2024-10-03 DIAGNOSIS — M6281 Muscle weakness (generalized): Secondary | ICD-10-CM | POA: Diagnosis not present

## 2024-10-03 DIAGNOSIS — I10 Essential (primary) hypertension: Secondary | ICD-10-CM | POA: Diagnosis not present

## 2024-10-04 DIAGNOSIS — M47816 Spondylosis without myelopathy or radiculopathy, lumbar region: Secondary | ICD-10-CM | POA: Diagnosis not present

## 2024-10-21 DIAGNOSIS — H00012 Hordeolum externum right lower eyelid: Secondary | ICD-10-CM | POA: Diagnosis not present

## 2024-10-25 DIAGNOSIS — M6281 Muscle weakness (generalized): Secondary | ICD-10-CM | POA: Diagnosis not present

## 2024-10-25 DIAGNOSIS — R262 Difficulty in walking, not elsewhere classified: Secondary | ICD-10-CM | POA: Diagnosis not present

## 2024-10-27 DIAGNOSIS — R262 Difficulty in walking, not elsewhere classified: Secondary | ICD-10-CM | POA: Diagnosis not present

## 2024-10-27 DIAGNOSIS — M6281 Muscle weakness (generalized): Secondary | ICD-10-CM | POA: Diagnosis not present

## 2024-10-28 DIAGNOSIS — I10 Essential (primary) hypertension: Secondary | ICD-10-CM | POA: Diagnosis not present

## 2024-10-28 DIAGNOSIS — M810 Age-related osteoporosis without current pathological fracture: Secondary | ICD-10-CM | POA: Diagnosis not present

## 2024-11-01 DIAGNOSIS — R262 Difficulty in walking, not elsewhere classified: Secondary | ICD-10-CM | POA: Diagnosis not present

## 2024-11-01 DIAGNOSIS — M6281 Muscle weakness (generalized): Secondary | ICD-10-CM | POA: Diagnosis not present

## 2024-11-02 DIAGNOSIS — I10 Essential (primary) hypertension: Secondary | ICD-10-CM | POA: Diagnosis not present

## 2024-11-03 DIAGNOSIS — M6281 Muscle weakness (generalized): Secondary | ICD-10-CM | POA: Diagnosis not present

## 2024-11-03 DIAGNOSIS — R262 Difficulty in walking, not elsewhere classified: Secondary | ICD-10-CM | POA: Diagnosis not present

## 2024-11-04 DIAGNOSIS — M47816 Spondylosis without myelopathy or radiculopathy, lumbar region: Secondary | ICD-10-CM | POA: Diagnosis not present

## 2024-11-07 DIAGNOSIS — M6281 Muscle weakness (generalized): Secondary | ICD-10-CM | POA: Diagnosis not present

## 2024-11-07 DIAGNOSIS — R262 Difficulty in walking, not elsewhere classified: Secondary | ICD-10-CM | POA: Diagnosis not present

## 2024-11-08 DIAGNOSIS — H5203 Hypermetropia, bilateral: Secondary | ICD-10-CM | POA: Diagnosis not present

## 2024-11-08 DIAGNOSIS — H353132 Nonexudative age-related macular degeneration, bilateral, intermediate dry stage: Secondary | ICD-10-CM | POA: Diagnosis not present

## 2024-11-08 DIAGNOSIS — H04123 Dry eye syndrome of bilateral lacrimal glands: Secondary | ICD-10-CM | POA: Diagnosis not present

## 2024-11-08 DIAGNOSIS — H26492 Other secondary cataract, left eye: Secondary | ICD-10-CM | POA: Diagnosis not present

## 2024-11-08 DIAGNOSIS — H524 Presbyopia: Secondary | ICD-10-CM | POA: Diagnosis not present

## 2024-11-08 DIAGNOSIS — H35033 Hypertensive retinopathy, bilateral: Secondary | ICD-10-CM | POA: Diagnosis not present

## 2024-11-09 DIAGNOSIS — M6281 Muscle weakness (generalized): Secondary | ICD-10-CM | POA: Diagnosis not present

## 2024-11-09 DIAGNOSIS — R262 Difficulty in walking, not elsewhere classified: Secondary | ICD-10-CM | POA: Diagnosis not present

## 2024-11-27 DIAGNOSIS — M810 Age-related osteoporosis without current pathological fracture: Secondary | ICD-10-CM | POA: Diagnosis not present

## 2024-11-27 DIAGNOSIS — I10 Essential (primary) hypertension: Secondary | ICD-10-CM | POA: Diagnosis not present
